# Patient Record
Sex: Male | Born: 1985 | Race: White | Hispanic: No
Health system: Western US, Academic
[De-identification: ages and names within clinical notes are randomized; demographics above are authoritative.]

## PROBLEM LIST (undated history)

## (undated) DEATH — deceased

---
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
## 2013-03-19 DIAGNOSIS — J309 Allergic rhinitis, unspecified: Secondary | ICD-10-CM

## 2013-03-19 HISTORY — DX: Allergic rhinitis, unspecified: J30.9

## 2014-12-08 ENCOUNTER — Ambulatory Visit: Payer: No Typology Code available for payment source | Attending: Family Medicine | Admitting: Physical Therapy

## 2014-12-08 DIAGNOSIS — M25551 Pain in right hip: Secondary | ICD-10-CM | POA: Insufficient documentation

## 2014-12-08 DIAGNOSIS — M6281 Muscle weakness (generalized): Secondary | ICD-10-CM | POA: Insufficient documentation

## 2014-12-08 NOTE — Therapy (Signed)
[facility] [address], [place], [address] Phone: [phone]   Fax:  [phone]  Physical Therapy Evaluation  Patient Details  Name: Kai Calico MRN: [id number] Date of Birth: 1985-07-12 Referring Provider:  Kandyce Rud, MD  Encounter Date: 12/08/2014      PT End of Session - 12/08/14 1209    Visit Number 1   Number of Visits 9   Date for PT Re-Evaluation 02/07/15   PT Start Time 0900   PT Stop Time 1000   PT Time Calculation (min) 60 min   Activity Tolerance Patient tolerated treatment well   Behavior During Therapy [facility] for tasks assessed/performed      No past medical history on file.  No past surgical history on file.  There were no vitals filed for this visit.  Visit Diagnosis:  Right hip pain - Plan: PT plan of care cert/re-cert  Muscle weakness - Plan: PT plan of care cert/re-cert      Subjective Assessment - 12/08/14 0901    Subjective Pt reports no pain currently.   Pertinent History Pt has h/o 7 months ago t noted pain with front squatting, Pain has worsened in that time due to pt continuing to squat. Pt does have intermittent low back pain on R. Pain is in B hips but worse in R.    How long can you sit comfortably? If aggravated pt has pain wiht sitting.    How long can you walk comfortably? when aggravated at terminal stance.   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   Currently in Pain? No/denies            [facility] PT Assessment - 12/08/14 0001    Assessment   Medical Diagnosis strain of hip flexor, right, initial encounter   Onset Date/Surgical Date 03/19/14   Prior Therapy none   Precautions   Precautions None   Restrictions   Weight Bearing Restrictions No   Balance Screen   Has the patient fallen in the past 6 months No   Has the patient had a decrease in activity level  because of a fear of falling?  No   Is the patient reluctant to leave their home because of a fear of falling?  No   Prior Function   Level of Independence Independent   Vocation Student   Vocation Requirements PT student requiring lifting, bending, moving people around   Leisure Pt is an active exerciser. Limited in this currently.   ROM / Strength   AROM / PROM / Strength AROM   AROM   Overall AROM Comments AROM: limited R hip ER/IR, limited R SLR. PROM = AROM. Did not assess strength formally however with all functional movements involving weighted hip motion (i.e. squat, quadruped) activation of perihip musculature incr. pt pain.   Palpation   Spinal mobility CPAs, UPAs non painful except L5 UPA reproduced pain. Pt also had notable incr. tone in R QL muscle.   Ambulation/Gait   Gait Comments R hip ER with gait, noted hip hike on R.        Objective: Trigger point dry needling performed on Proximal quadriceps, glutes. Pt had significant pain with this and noted "gravelly" feel to the needle. Following this pt had decr. Pain with hip flexor stretch which was previously painful.  Extensive manual stretching for piriformis, 5x1 min performed.  Educated pt on home performance of manual stretch using his wife or another PT student to perform, with cuing to minimize pain in this position. Also educated pt on avoiding aggravating activities.  CPAs and UPAs grade IV 2x1 min L4-L5 (right side for UPAs)  Next session look to progress to exercise as appropriate.                    PT Education - 12/08/14 1209    Education provided Yes   Education Details Educated pt on minimizing painful/aggravating activities, course of PT progression   Person(s) Educated Patient   Methods Explanation;Verbal cues   Comprehension Verbalized understanding             PT Long Term Goals - 12/08/14 1212    PT LONG TERM GOAL #1   Title Pt will ibe able to perform pain free hooklying  to indicate decr. pain with basic low level functional tasks   Time 8   Period Weeks   Status New   PT LONG TERM GOAL #2   Title Pt will be able to squat to lift a pt/transfer with no incr. pain to return to functional school related activities.   Time 8   Period Weeks   Status New               Plan - 12/08/14 1210    Clinical Impression Statement Pt is a pleasant 29 y/o male with c/o chronic R hip pain. Pain is primarily with functional tasks related to pt schooling in PT, including weighted squat, assisting with transfers, as well as pain with walking and when working out. Pt presents with decr. ROM in R hip and significant pain with palpation over R iliacus, proximal hip flexors, glutes. Pt would benefit from skilled PT to address these issues and address poor motor control with higher level functional tasks.   Pt will benefit from skilled therapeutic intervention in order to improve on the following deficits Improper body mechanics;Pain;Hypomobility;Decreased range of motion;Decreased strength   Rehab Potential Good   Clinical Impairments Affecting Rehab Potential good understanding of therapy process, pt is committed to exercising even if it is painful   PT Frequency 1x / week   PT Duration 8 weeks   PT Treatment/Interventions Therapeutic activities;Manual techniques;Therapeutic exercise   PT Next Visit Plan address trigger points, progress to hooklying heel slides.   Consulted and Agree with Plan of Care Patient         Problem List There are no active problems to display for this patient.   [name],[name] 12/08/2014, 12:15 PM  [facility] [address], [place], [address] Phone: [phone]   Fax:  [phone]

## 2014-12-14 ENCOUNTER — Ambulatory Visit: Payer: No Typology Code available for payment source | Admitting: Physical Therapy

## 2014-12-15 ENCOUNTER — Ambulatory Visit: Payer: No Typology Code available for payment source | Admitting: Physical Therapy

## 2014-12-15 DIAGNOSIS — M25551 Pain in right hip: Secondary | ICD-10-CM | POA: Diagnosis not present

## 2014-12-15 DIAGNOSIS — M6281 Muscle weakness (generalized): Secondary | ICD-10-CM

## 2014-12-15 NOTE — Therapy (Signed)
Jesus Sampson [facility] [address], [place], [address] Phone: [phone]   Fax:  [phone]  Physical Therapy Treatment  Patient Details  Name: Jesus Sampson MRN: [id number] Date of Birth: 22-May-1985 Referring Provider:  Dedra Skeens, PA-C  Encounter Date: 12/15/2014      PT End of Session - 12/15/14 0803    Visit Number 2   Number of Visits 9   Date for PT Re-Evaluation 02/07/15   PT Start Time 0715   PT Stop Time 0800   PT Time Calculation (min) 45 min   Activity Tolerance Patient tolerated treatment well   Behavior During Therapy [facility] for tasks assessed/performed      No past medical history on file.  No past surgical history on file.  There were no vitals filed for this visit.  Visit Diagnosis:  Right hip pain  Muscle weakness      Subjective Assessment - 12/15/14 0718    Subjective (p) Pt reports general hip soreness, no specific pain as he has been avoiding pain.   Pertinent History (p) Pt has h/o 7 months ago t noted pain with front squatting, Pain has worsened in that time due to pt continuing to squat. Pt does have intermittent low back pain on R. Pain is in B hips but worse in R.    How long can you sit comfortably? (p) If aggravated pt has pain wiht sitting.    How long can you walk comfortably? (p) when aggravated at terminal stance.   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   Currently in Pain? (p) No/denies            Objective: Focus of session on illustrating and correcting pt inability to shift and bear weight neutrally through the hip.  Side to side wt shift initially, followed by run stance wt shift. Cuing: Suspend the crown Knees/hips bent Relaxed chest and ribs dropped Focusing on relaxation and avoiding weight dropping through joint but through muscles including manual tapping  to activate musculature.  Unable to do so with run stance with R LE back, able to do so with L LE back.  Trigger point dry needling performed on TFL and proximal quad to improve ability to shift wt.  Following this pt had marked improvement, decr. Stiffness and improved ability to shift weight into LE musculature.                       PT Education - 12/15/14 0801    Education provided Yes   Education Details educated pt on wt bearing through R hip.   Person(s) Educated Patient   Methods Explanation;Verbal cues   Comprehension Verbalized understanding             PT Long Term Goals - 12/08/14 1212    PT LONG TERM GOAL #1   Title Pt will ibe able to perform pain free hooklying to indicate decr. pain with basic low level functional tasks   Time 8   Period Weeks   Status New   PT LONG TERM GOAL #2   Title Pt will be able to squat to lift a pt/transfer with no incr. pain to return to functional school related activities.   Time 8   Period Weeks   Status New  Plan - 12/15/14 0803    Clinical Impression Statement Pt responded well within session to muscle control and joint centration exercises, as well as dry needling.  Overall pain is improved however pt demonstrates antalgic gait and impaired ability to perform functional wt shifts.   Pt will benefit from skilled therapeutic intervention in order to improve on the following deficits Improper body mechanics;Pain;Hypomobility;Decreased range of motion;Decreased strength   Rehab Potential Good   Clinical Impairments Affecting Rehab Potential good understanding of therapy process, pt is committed to exercising even if it is painful   PT Frequency 1x / week   PT Duration 8 weeks   PT Treatment/Interventions Therapeutic activities;Manual techniques;Therapeutic exercise   PT Next Visit Plan address trigger points, progress to hooklying heel slides.   Consulted and Agree with Plan of Care  Patient        Problem List There are no active problems to display for this patient.   [name],[name] 12/15/2014, 8:08 AM  [facility] SPORTS MEDIC[facility] [address] [place], [address] Phone: [phone]   Fax:  [phone]

## 2014-12-20 ENCOUNTER — Encounter: Payer: No Typology Code available for payment source | Admitting: Physical Therapy

## 2014-12-22 ENCOUNTER — Ambulatory Visit: Payer: No Typology Code available for payment source | Attending: Orthopedic Surgery | Admitting: Physical Therapy

## 2014-12-22 DIAGNOSIS — M25551 Pain in right hip: Secondary | ICD-10-CM | POA: Insufficient documentation

## 2014-12-22 NOTE — Therapy (Signed)
[place] [facility] [address] [place], [address] Phone: [phone]   Fax:  [phone]  Physical Therapy Treatment  Patient Details  Name: Jesus Sampson MRN: [id number] Date of Birth: 1985-04-22 Referring Provider:  Dedra Skeens, PA-C  Encounter Date: 12/22/2014      PT End of Session - 12/22/14 0851    Visit Number 3   Number of Visits 9   Date for PT Re-Evaluation 02/07/15   PT Start Time 0800   PT Stop Time 0840   PT Time Calculation (min) 40 min   Activity Tolerance Patient tolerated treatment well   Behavior During Therapy [facility] for tasks assessed/performed      No past medical history on file.  No past surgical history on file.  There were no vitals filed for this visit.  Visit Diagnosis:  Right hip pain      Subjective Assessment - 12/22/14 0804    Subjective Pt reports he is continuing to modify his activity level to avoid pain. Reports he is still having difficulty with squatting and muscle activiation on R side.   Pertinent History Pt has h/o 7 months ago t noted pain with front squatting, Pain has worsened in that time due to pt continuing to squat. Pt does have intermittent low back pain on R. Pain is in B hips but worse in R.    How long can you sit comfortably? If aggravated pt has pain wiht sitting.    How long can you walk comfortably? when aggravated at terminal stance.   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   Currently in Pain? No/denies             Objective: Wt shifting in run stance both sides - doubled green band with backward shift, single red band for forward shift. 20 min total with extensive practice to achieve neutral pelvic position, avoiding substitution with shoulders, activation of shoulders.  Bridge variations focusing on pre-movement for improved stability, educated pt on  leg starting at the psoas (bottom rib) rather than lower down with excess stabilization with adductors.  Additional cuing to keep wt through foot in more neutral position to address hip control.                    PT Education - 12/22/14 0851    Education provided Yes   Education Details educated on progressive control through R hip.   Person(s) Educated Patient   Methods Explanation;Demonstration   Comprehension Verbalized understanding             PT Long Term Goals - 12/22/14 0858    PT LONG TERM GOAL #1   Title Pt will ibe able to perform pain free hooklying to indicate decr. pain with basic low level functional tasks   Time 8   Period Weeks   Status New   PT LONG TERM GOAL #2   Title Pt will be able to squat to lift a pt/transfer with no incr. pain to return to functional school related activities.   Time 8   Period Weeks   Status New               Plan - 12/22/14 1324    Clinical Impression Statement Pt is continuing to make significant improvement in use of R LE. Pt is unable to continue with PT  at this time as pt is unable to pay for visits.   Pt will benefit from skilled therapeutic intervention in order to improve on the following deficits Improper body mechanics;Pain;Hypomobility;Decreased range of motion;Decreased strength   Rehab Potential Good   Clinical Impairments Affecting Rehab Potential good understanding of therapy process, pt is committed to exercising even if it is painful   PT Frequency 1x / week   PT Duration 8 weeks   PT Treatment/Interventions Therapeutic activities;Manual techniques;Therapeutic exercise   PT Next Visit Plan address trigger points, progress to hooklying heel slides.   Consulted and Agree with Plan of Care Patient        Problem List There are no active problems to display for this patient.   Linzy Laury 12/22/2014, 9:00 AM  [facility]  MEDICINE [address] [place], [address] Phone: [phone]   Fax:  [phone]

## 2014-12-29 ENCOUNTER — Encounter: Payer: No Typology Code available for payment source | Admitting: Physical Therapy

## 2015-01-05 ENCOUNTER — Encounter: Payer: No Typology Code available for payment source | Admitting: Physical Therapy

## 2016-03-07 ENCOUNTER — Encounter: Payer: Self-pay | Admitting: Urology

## 2016-03-07 ENCOUNTER — Ambulatory Visit (INDEPENDENT_AMBULATORY_CARE_PROVIDER_SITE_OTHER): Payer: Medicaid Other | Admitting: Urology

## 2016-03-07 VITALS — BP 115/70 | HR 78 | Ht 68.0 in | Wt 187.9 lb

## 2016-03-07 DIAGNOSIS — Z3009 Encounter for other general counseling and advice on contraception: Secondary | ICD-10-CM

## 2016-03-07 MED ORDER — DIAZEPAM 10 MG PO TABS: ORAL_TABLET | ORAL | 0 refills | Status: AC

## 2016-03-07 NOTE — Progress Notes (Signed)
03/07/2016 10:32 AM   Pervis HockingKody Francee PiccoloBrandt 12/30/1985 [id number]  Referring provider: Kandyce RudMarcus Babaoff, MD [address] [facility] [place] [facility] [place], [place] [id number]  Chief Complaint  Patient presents with  . VAS Consult    new patient referred by Dr. Madelin RearBaboff    HPI: Mr. Michel BickersKody Sampson is 30 year old Caucasian male who presents today as a referral for  a vasectomy  Patient has three children, 2 daughters and one son, and wishes to end his family unit at this point.  Patient denied any history of chronic prostatitis, epididymitis, orchitis, or other genital pain.  Today, we discussed what the vas deferens is, where it is located, and its function. We reviewed the procedure for vasectomy, it's risks, benefits, alternatives, and likelihood of achieving his goals.   We discussed in detail the procedure, complications, and recovery as well as the need for clearance prior to unprotected intercourse. We discussed that vasectomy does not protect against sexually transmitted diseases. We discussed that this procedure does not result in immediate sterility and that they would need to use other forms of birth control until he has been cleared with a three month negative postvasectomy semen analyses.  I explained that the procedure is considered to be permanent and that attempts at reversal have varying degrees of success. These options include vasectomy reversal, sperm retrieval, and in vitro fertilization; these can be very expensive.   We discussed the chance of postvasectomy pain syndrome which occurs in less than 5% of patients. I explained to the patient that there is no treatment to resolve this chronic pain, and that if it developed I would not be able to help resolve the issue, but that surgery is generally not needed for correction.   I explained there have even been reports of systemic like illness associated with this chronic pain, and that there was no good cure. I  explained that vasectomy it is not a 100% reliable form of birth control, and the risk of pregnancy after vasectomy is approximately 1 in 2000 men who had a negative postvasectomy semen analysis or rare non-motile sperm.  I explained that repeat vasectomy was necessary in less than 1% of vasectomy procedures when employing the type of technique that is performed in the office. I explained that he should refrain from ejaculation for approximately one week following vasectomy. I explained that there are other options for birth control which are permanent and non-permanent; we discussed these.  I explained the rates of surgical complications, such as symptomatic hematoma or infection, are low (1-2%) and vary with the surgeon's experience and criteria used to diagnose the complication.   PMH: History reviewed. No pertinent past medical history.  Surgical History: Past Surgical History:  Procedure Laterality Date  . HERNIA REPAIR  1992    Home Medications:  Allergies as of 03/07/2016   No Known Allergies     Medication List       Accurate as of 03/07/16 10:32 AM. Always use your most recent med list.          diazepam 10 MG tablet Commonly known as:  VALIUM Take thirty minutes prior to the vasectomy       Allergies: No Known Allergies  Family History: Family History  Problem Relation Age of Onset  . Kidney cancer Neg Hx   . Kidney disease Neg Hx   . Prostate cancer Neg Hx   . Bladder Cancer Neg Hx  Social History:  reports that he has never smoked. He has never used smokeless tobacco. He reports that he does not drink alcohol or use drugs.  ROS: UROLOGY Frequent Urination?: No Hard to postpone urination?: No Burning/pain with urination?: No Get up at night to urinate?: No Leakage of urine?: No Urine stream starts and stops?: No Trouble starting stream?: No Do you have to strain to urinate?: No Blood in urine?: No Urinary tract infection?: No Sexually  transmitted disease?: No Injury to kidneys or bladder?: No Painful intercourse?: No Weak stream?: No Erection problems?: No Penile pain?: No  Gastrointestinal Nausea?: No Vomiting?: No Indigestion/heartburn?: No Diarrhea?: No Constipation?: No  Constitutional Fever: No Night sweats?: No Weight loss?: No Fatigue?: No  Skin Skin rash/lesions?: No Itching?: No  Eyes Blurred vision?: No Double vision?: No  Ears/Nose/Throat Sore throat?: Yes Sinus problems?: Yes  Hematologic/Lymphatic Swollen glands?: No Easy bruising?: No  Cardiovascular Leg swelling?: No Chest pain?: Yes  Respiratory Cough?: No Shortness of breath?: No  Endocrine Excessive thirst?: No  Musculoskeletal Back pain?: No Joint pain?: No  Neurological Headaches?: No Dizziness?: No  Psychologic Depression?: No Anxiety?: No  Physical Exam: BP 115/70   Pulse 78   Ht  (1.727 m)   Wt 187 lb 14.4 oz (85.2 kg)   BMI 28.57 kg/m   Constitutional: Well nourished. Alert and oriented, No acute distress. HEENT: [place] AT, moist mucus membranes. Trachea midline, no masses. Cardiovascular: No clubbing, cyanosis, or edema. Respiratory: Normal respiratory effort, no increased work of breathing. GI: Abdomen is soft, non tender, non distended, no abdominal masses. Liver and spleen not palpable.  No hernias appreciated.  Stool sample for occult testing is not indicated.   GU: No CVA tenderness.  No bladder fullness or masses.  Patient with circumcised phallus.  Urethral meatus is patent.  No penile discharge. No penile lesions or rashes. Scrotum without lesions, cysts, rashes and/or edema.  Testicles are located scrotally bilaterally. No masses are appreciated in the testicles. Left and right epididymis are normal. Rectal: Deferred. Skin: No rashes, bruises or suspicious lesions. Lymph: No cervical or inguinal adenopathy. Neurologic: Grossly intact, no focal deficits, moving all 4  extremities. Psychiatric: Normal mood and affect.  Assessment & Plan:    1. Vasectomy consult:  Patient has read and signed the consent.  He is given the pre-op vasectomy instruction sheet.  He is prescribed Valium 10 mg and instructed to take it 30 minutes prior to his vasectomy appointment.  He is to have a driver.  I reemphasized to the patient that this is to be considered a permanent form of birth control, that he is to use an alternative form of birth control until we receive the 3 months specimen and it is cleared of sperm and that this will not prevent STI's.  His questions are answered to his satisfaction and he understands the risks and is willing to proceed with the vasectomy.  He will schedule his vasectomy.    I spent 30 minutes in a face-to-face conversation concerning the vasectomy procedure and pre-and post op expectations.  Greater than 50% was spent in counseling & coordination of care with the patient.  Return for vasectomy.  These notes generated with voice recognition software. I apologize for typographical errors.  Michiel Cowboy, PA-C  [facility] [address] [place], [place] [address] [phone]

## 2016-03-09 ENCOUNTER — Encounter: Payer: Self-pay | Admitting: Emergency Medicine

## 2016-03-09 ENCOUNTER — Emergency Department: Payer: Medicaid Other

## 2016-03-09 ENCOUNTER — Emergency Department
Admission: EM | Admit: 2016-03-09 | Discharge: 2016-03-09 | Disposition: A | Discharge status: Home / Self Care | Payer: Medicaid Other | Attending: Emergency Medicine | Admitting: Emergency Medicine

## 2016-03-09 DIAGNOSIS — R0789 Other chest pain: Secondary | ICD-10-CM | POA: Diagnosis not present

## 2016-03-09 DIAGNOSIS — R590 Localized enlarged lymph nodes: Secondary | ICD-10-CM

## 2016-03-09 DIAGNOSIS — R1032 Left lower quadrant pain: Secondary | ICD-10-CM | POA: Diagnosis present

## 2016-03-09 LAB — CBC
HEMATOCRIT: 46.5 % (ref 40.0–52.0)
Hemoglobin: 16.4 g/dL (ref 13.0–18.0)
MCH: 29.8 pg (ref 26.0–34.0)
MCHC: 35.2 g/dL (ref 32.0–36.0)
MCV: 84.8 fL (ref 80.0–100.0)
Platelets: 211 10*3/uL (ref 150–440)
RBC: 5.48 MIL/uL (ref 4.40–5.90)
RDW: 13 % (ref 11.5–14.5)
WBC: 8.2 10*3/uL (ref 3.8–10.6)

## 2016-03-09 LAB — FIBRIN DERIVATIVES D-DIMER (ARMC ONLY): Fibrin derivatives D-dimer (ARMC): 500 — ABNORMAL HIGH (ref 0–499)

## 2016-03-09 NOTE — ED Notes (Signed)
Pt alert and oriented X4, active, cooperative, pt in NAD. RR even and unlabored, color WNL.  Pt informed to return if any life threatening symptoms occur.   
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
## 2016-03-09 NOTE — ED Triage Notes (Signed)
Pain in the left hip with radiation into the back. Ambulatory in triage. Denies trauma

## 2016-03-09 NOTE — ED Notes (Signed)
Pt presents with hip pain x 5-6 days. Started in front and now radiates to back side of hip. Denies injury or trauma. Pt is physically active, walking with no difficulty. NAD noted.

## 2016-03-09 NOTE — Discharge Instructions (Signed)
Please apply heating pa left groin. Continue to monitor lymph nodes. If continued swelling after 2 weeks, follow-up with primary care physician. Return to the emergency department for any worsening symptoms urgent changes in her health.

## 2016-03-09 NOTE — ED Provider Notes (Signed)
ED ECG REPORT I, Arelia LongestSchaevitz,  David M, the attending physician, personally viewed and interpreted this ECG.   Date: 03/09/2016  EKG Time: 1834  Rate: 60  Rhythm: sinus rhythm with sinus arrhythmia.  Axis: normal  Intervals:none  ST&T Change: no st segment elevation or depression. No abnormal T-wave inversion.    Myrna Blazeravid Matthew Schaevitz, MD 03/09/16 [phone]

## 2016-03-09 NOTE — ED Notes (Signed)
Pt transported to xray 

## 2016-03-19 HISTORY — PX: LIGATION PRQ VAS DEFERENS UNI/BI SPX: 55450

## 2016-04-13 ENCOUNTER — Encounter: Payer: Self-pay | Admitting: Urology

## 2016-04-13 ENCOUNTER — Ambulatory Visit (INDEPENDENT_AMBULATORY_CARE_PROVIDER_SITE_OTHER): Payer: Medicaid Other | Admitting: Urology

## 2016-04-13 DIAGNOSIS — Z9852 Vasectomy status: Secondary | ICD-10-CM | POA: Diagnosis not present

## 2016-04-13 NOTE — Progress Notes (Signed)
04/13/16  CC: Vasectomy    HPI:  1 - Desire for Male Sterilization - pt with  three children, 2 daughters and one son, and wishes to end his family unit at this point.  Patient denied any history of chronic prostatitis, epididymitis, orchitis, or other genital pain. Vas easily palpable on exam.     Bilateral Vasectomy Procedure  Pre-Procedure: - Patient's scrotum was prepped and draped for vasectomy. - The vas was palpated through the scrotal skin on the left. - 1% Xylocaine was injected into the skin and surrounding tissue for placement  - In a similar manner, the vas on the right was identified, anesthetized, and stabilized.  Procedure: - A bladeless technique was used to open the overlying skin (sharp dissection) - The left vas was isolated and brought up through the incision exposing that structure. - Bleeding points were cauterized as they occurred. - The vas was free from the surrounding structures and brought to the view. - A segment was positioned for placement with a hemostat. - A second hemostat was placed and a 1cm segment between the two hemostats and was removed for inspection. - Each end of the transected vas lumen was fulgurated/ obliterated using needlepoint electrocautery -A fascial interposition was performed on testicular end of the vas using #3-0 chromic suture -The same procedure was performed on the right. - A single suture of #3-0 chromic catgut was used to close each lateral scrotal skin incision - A dressing was applied.  Post-Procedure: - Patient was instructed in care of the operative area - A specimen is to be delivered in 12 weeks   -Another form of contraception is to be used until post vasectomy semen analysis is confirmed negative, he voiced understanding.   Sebastian AcheMANNY, Valecia Beske, MD

## 2016-04-27 ENCOUNTER — Telehealth: Payer: Self-pay

## 2016-04-27 NOTE — Telephone Encounter (Signed)
The pt called stating he still noticing a scanty amount bleeding from his incision site. I informed him that it is normal and its probably because he have increased his activity level. He informed me that he have been working out at [employer].  I informed him to take it easy for the next few days and if symptoms do not improve to call us back. Please advise

## 2016-05-16 ENCOUNTER — Other Ambulatory Visit: Payer: Medicaid Other

## 2016-05-16 DIAGNOSIS — Z9852 Vasectomy status: Secondary | ICD-10-CM

## 2016-05-17 LAB — POST-VAS SPERM EVALUATION,QUAL: SEMEN VOLUME: 2.7 mL

## 2016-06-11 ENCOUNTER — Other Ambulatory Visit: Payer: Medicaid Other

## 2016-06-11 DIAGNOSIS — Z9852 Vasectomy status: Secondary | ICD-10-CM

## 2016-06-13 LAB — POST-VAS SPERM EVALUATION,QUAL: SEMEN VOLUME: 2.5 mL

## 2016-08-14 ENCOUNTER — Other Ambulatory Visit: Payer: Self-pay | Admitting: Family Medicine

## 2016-08-14 DIAGNOSIS — M25511 Pain in right shoulder: Secondary | ICD-10-CM

## 2016-08-23 ENCOUNTER — Ambulatory Visit: Payer: No Typology Code available for payment source

## 2016-08-23 ENCOUNTER — Other Ambulatory Visit: Payer: No Typology Code available for payment source

## 2016-08-28 ENCOUNTER — Ambulatory Visit
Admission: RE | Admit: 2016-08-28 | Discharge: 2016-08-28 | Disposition: A | Discharge status: Home / Self Care | Payer: Medicaid Other | Source: Ambulatory Visit | Attending: Family Medicine | Admitting: Family Medicine

## 2016-08-28 DIAGNOSIS — M25511 Pain in right shoulder: Secondary | ICD-10-CM

## 2016-08-28 DIAGNOSIS — X58XXXA Exposure to other specified factors, initial encounter: Secondary | ICD-10-CM | POA: Diagnosis not present

## 2016-08-28 DIAGNOSIS — S43491A Other sprain of right shoulder joint, initial encounter: Secondary | ICD-10-CM | POA: Diagnosis not present

## 2016-08-28 MED ORDER — IOPAMIDOL (ISOVUE-200) INJECTION 41%
50.0000 mL | Freq: Once | INTRAVENOUS | Status: AC | PRN
Start: 2016-08-28 — End: 2016-08-28
  Administered 2016-08-28: 15 mL
  Filled 2016-08-28: qty 50

## 2016-08-28 MED ORDER — GADOBENATE DIMEGLUMINE 529 MG/ML IV SOLN
5.0000 mL | Freq: Once | INTRAVENOUS | Status: AC | PRN
  Administered 2016-08-28: 0.01 mL via INTRA_ARTICULAR

## 2016-08-28 MED ORDER — SODIUM CHLORIDE 0.9 % IJ SOLN
10.0000 mL | INTRAMUSCULAR | Status: DC | PRN
  Administered 2016-08-28: 20 mL
  Filled 2016-08-28: qty 10

## 2016-08-28 MED ORDER — LIDOCAINE HCL (PF) 1 % IJ SOLN
30.0000 mL | Freq: Once | INTRAMUSCULAR | Status: AC
  Administered 2016-08-28: 10 mL

## 2018-06-01 IMAGING — US US EXTREM LOW VENOUS*L*
1 series · 13 of 24 positions shown · non-contrast
Comparison: None.

CLINICAL DATA: Left leg pain and swelling



[Series 1: us extrem low venous*left* · 0.05mm/px · 13 of 34 slices shown]
[im 1/34]
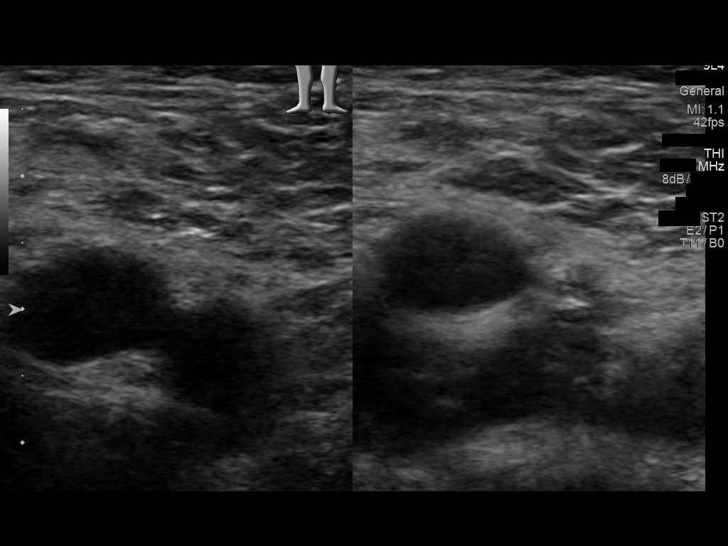
[im 3/34]
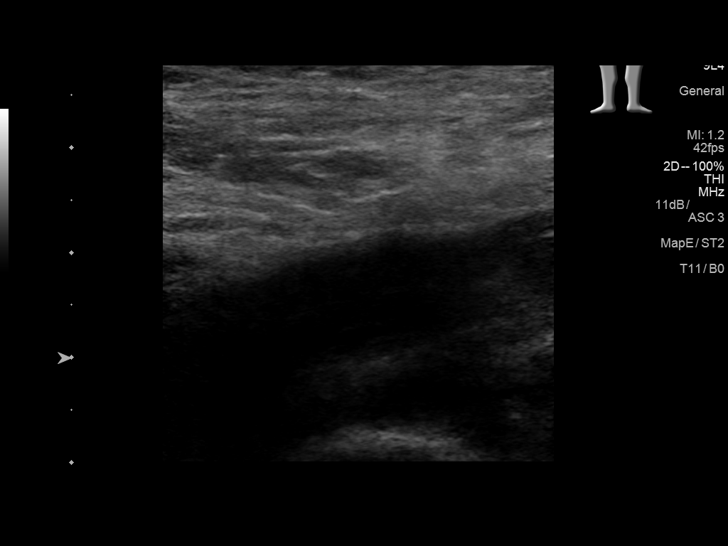
[im 6/34]
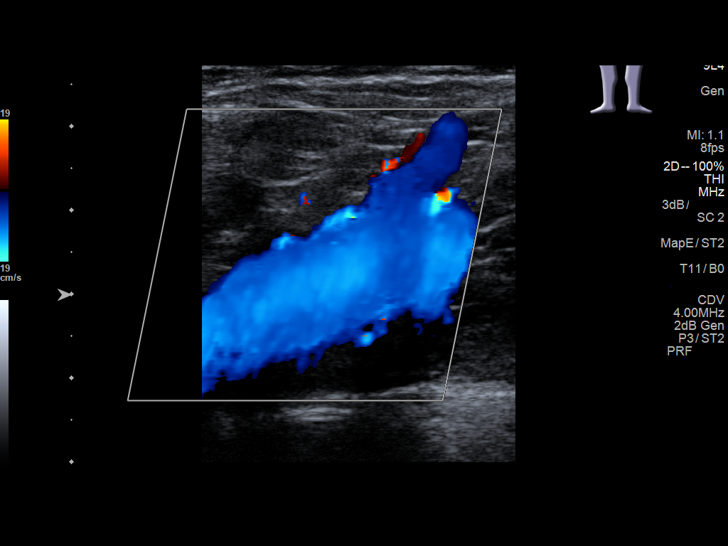
[im 9/34]
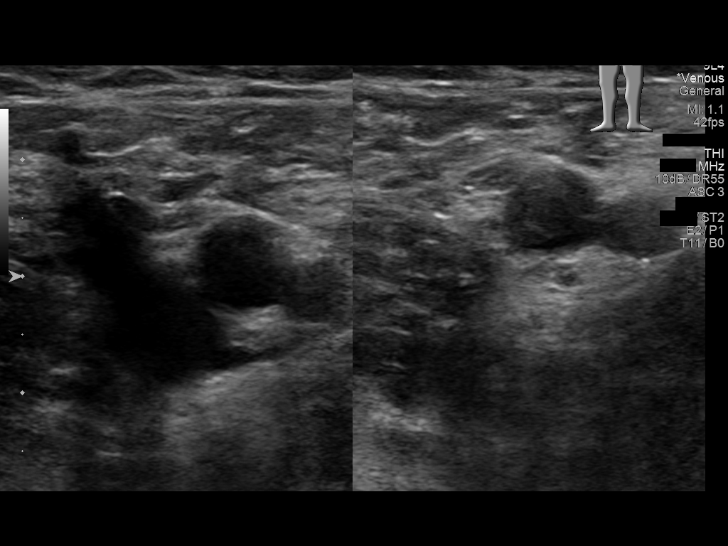
[im 12/34]
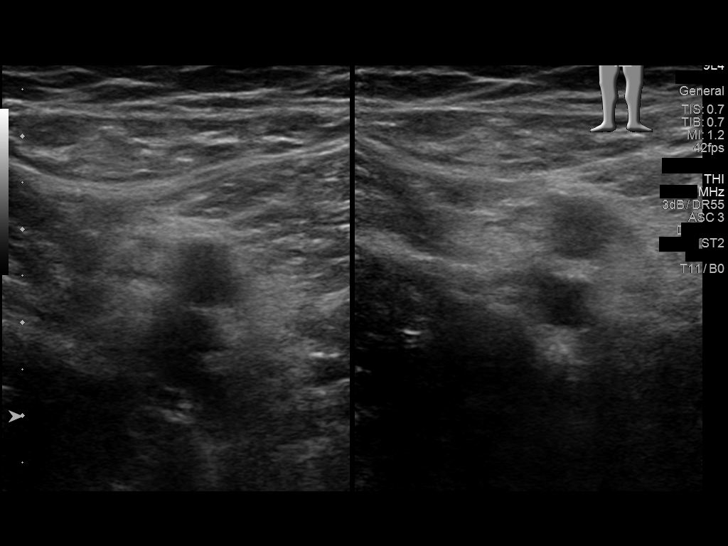
[im 15/34]
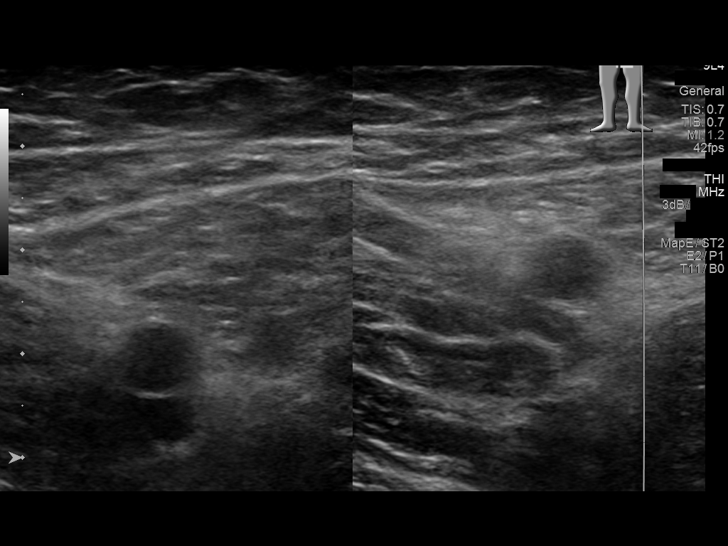
[im 18/34]
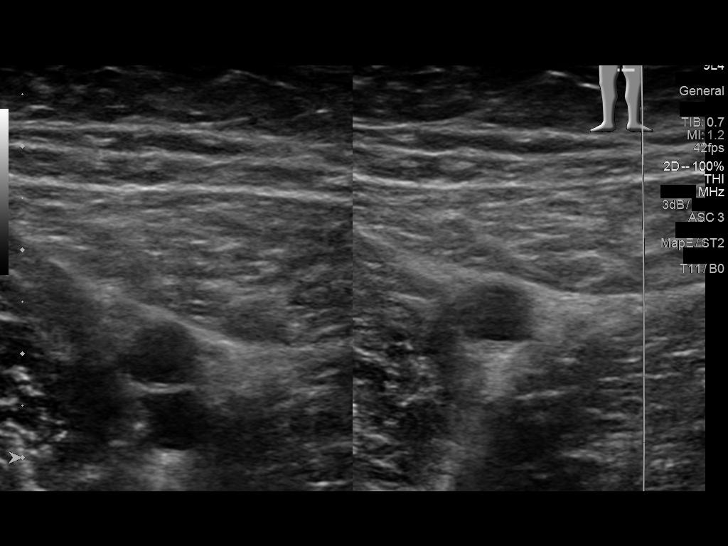
[im 19/34]
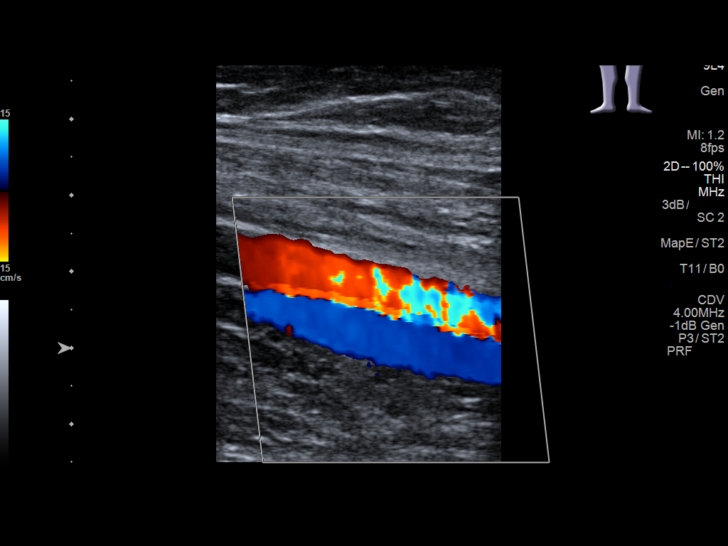
[im 22/34]
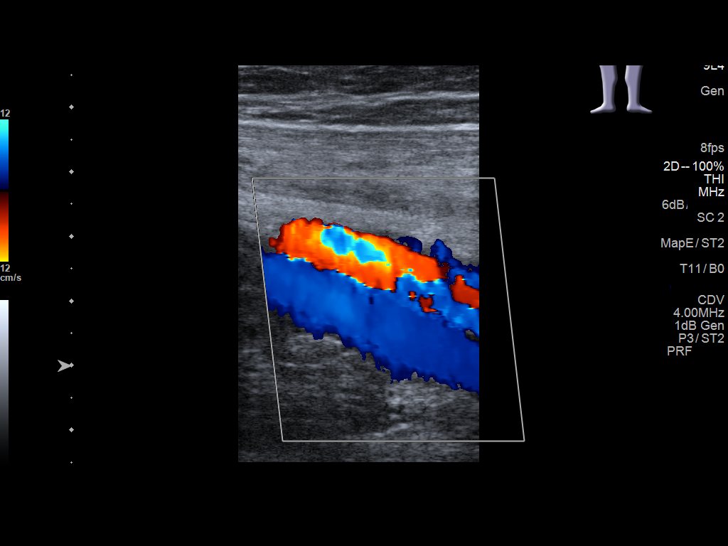
[im 25/34]
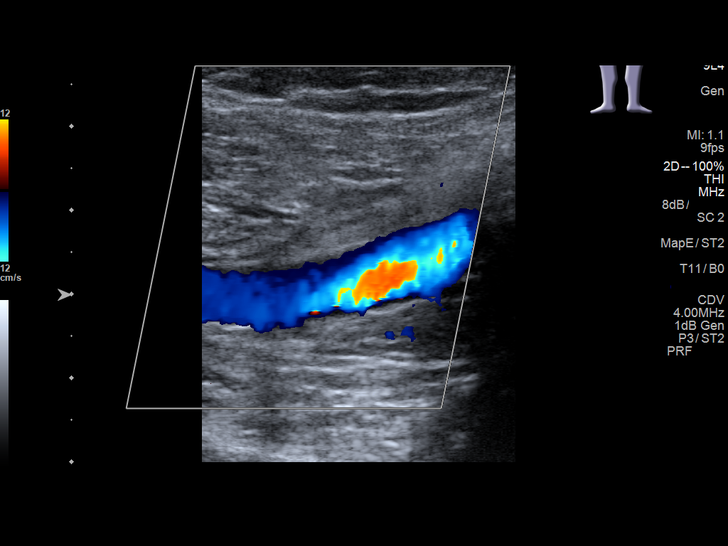
[im 28/34]
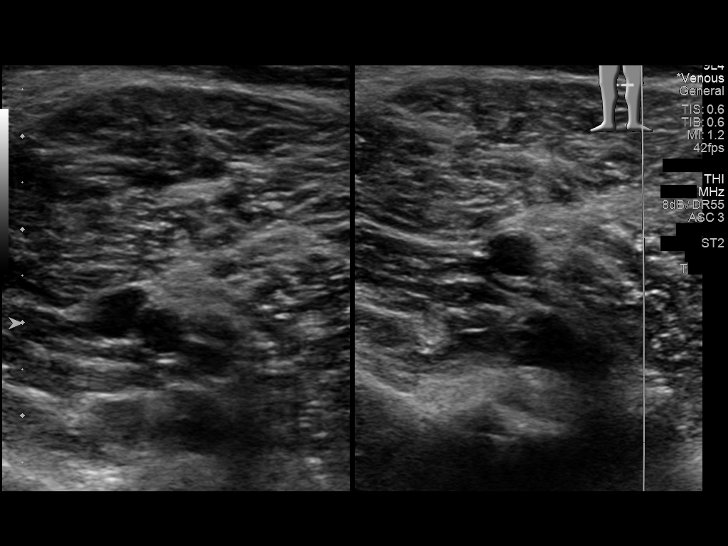
[im 31/34]
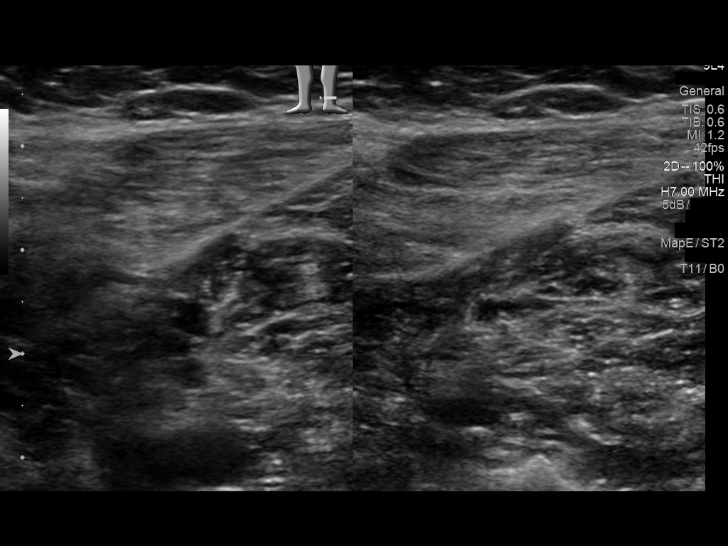
[im 34/34]
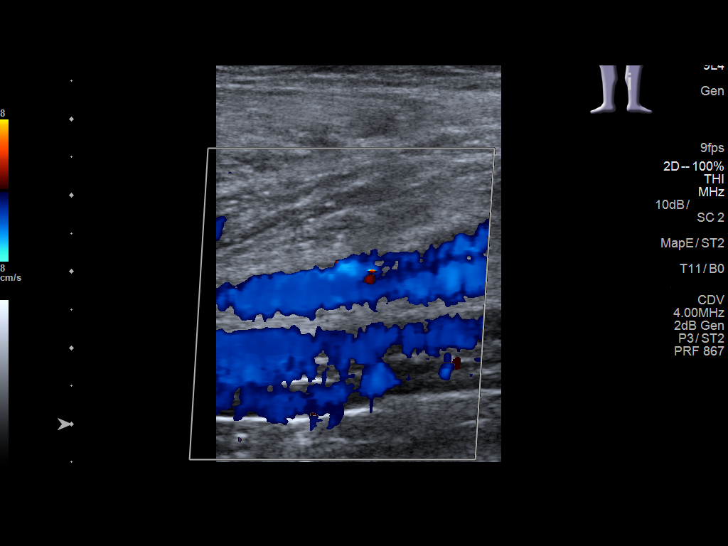

[13 of 24 positions shown; findings below may reference images not displayed]

FINDINGS: Contralateral Common Femoral Vein: Respiratory phasicity is normal
and symmetric with the symptomatic side. No evidence of thrombus.
Normal compressibility.

Common Femoral Vein: No evidence of thrombus. Normal
compressibility, respiratory phasicity and response to augmentation.

Saphenofemoral Junction: No evidence of thrombus. Normal
compressibility and flow on color Doppler imaging.

Profunda Femoral Vein: No evidence of thrombus. Normal
compressibility and flow on color Doppler imaging.

Femoral Vein: No evidence of thrombus. Normal compressibility,
respiratory phasicity and response to augmentation.

Popliteal Vein: No evidence of thrombus. Normal compressibility,
respiratory phasicity and response to augmentation.

Calf Veins: No evidence of thrombus. Normal compressibility and flow
on color Doppler imaging.

Superficial Great Saphenous Vein: No evidence of thrombus. Normal
compressibility and flow on color Doppler imaging.

Venous Reflux:  None.

Other Findings:  None.
IMPRESSION: No evidence of deep venous thrombosis.

## 2018-11-03 IMAGING — RF DG FLUORO GUIDE NDL PLC/BX
1 series · 1 of 1 positions shown · non-contrast
Comparison: none

CLINICAL DATA: Right shoulder pain for 4-5 months

[Series 1: cp_standard · 0.26mm/px · 1 of 1 slices shown]
[im 1/1]
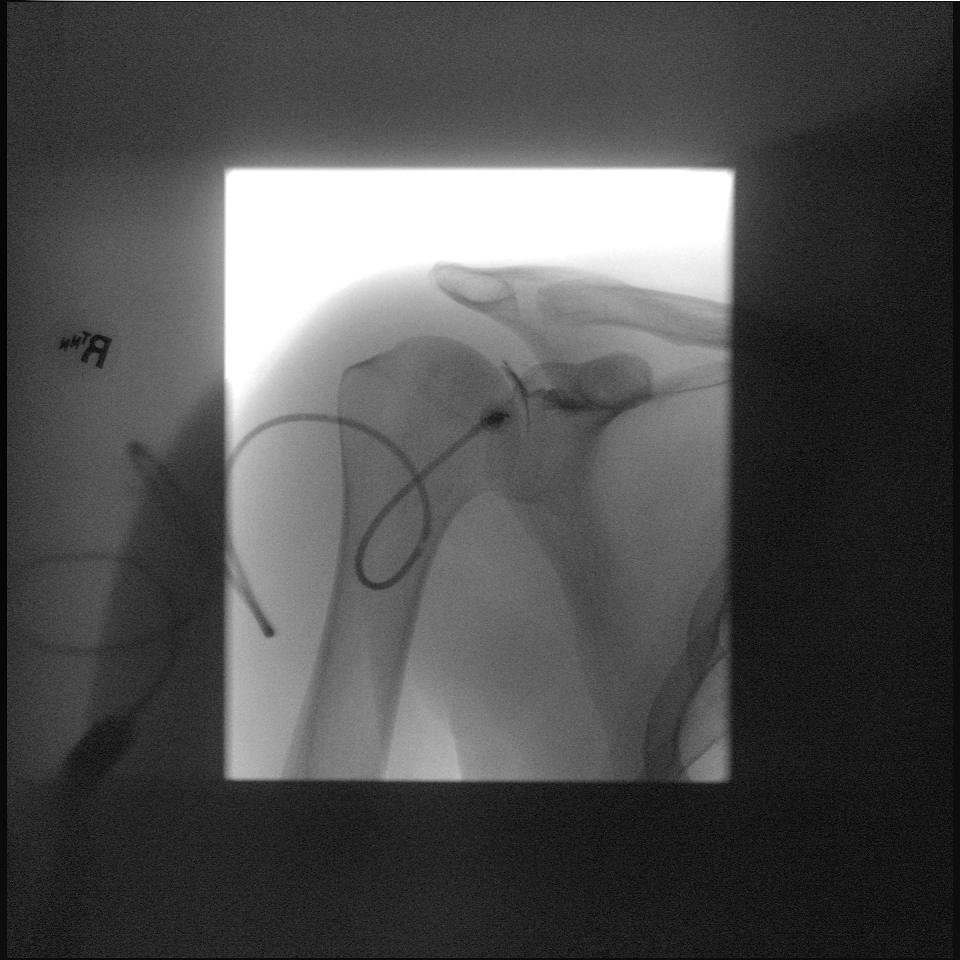

[1 of 1 positions shown; findings below may reference images not displayed]

EXAM:
RIGHT SHOULDER ARTHROGRAM INJECTION UNDER FLUOROSCOPY

FLUOROSCOPY TIME:  Fluoroscopy Time:  0.3 minutes

Radiation Exposure Index (if provided by the fluoroscopic device):
1.7 mGy

Number of Acquired Spot Images: 0

PROCEDURE:
The risks and benefits of the procedure were discussed with the
patient, and written informed consent was obtained. The patient
stated no history of allergy to contrast media. A formal timeout
procedure was performed with the patient according to departmental
protocol.

The patient was placed supine on the fluoroscopy table and the right
glenohumeral joint was identified under fluoroscopy. The skin
overlying the right glenohumeral joint was subsequently cleaned with
Chloraprep and a sterile drape was placed over the area of interest.
5 ml 1% Lidocaine was used to anesthetize the skin around the needle
insertion site.

A 22 gauge spinal needle was inserted into the right glenohumeral
joint under fluoroscopy. Position was confirmed with injection of
less than 1ml of Isovue 200 under fluoroscopy.

12 ml of gadolinium mixture (0.1 ml of Multihance mixed with 20 ml
of sterile saline) was injected into the right glenohumeral joint.

The needle was removed and hemostasis was achieved. The patient was
subsequently transferred to MRI for imaging.
IMPRESSION: Successful fluoroscopic guided right shoulder arthrogram prior to
MRI.

## 2019-05-06 ENCOUNTER — Encounter (HOSPITAL_BASED_OUTPATIENT_CLINIC_OR_DEPARTMENT_OTHER): Payer: No Typology Code available for payment source | Admitting: Urology

## 2019-05-23 NOTE — Progress Notes (Deleted)
[facility] NEW PATIENT VISIT    ID/CC:    Chief Complaint   Patient presents with   . New Patient     Vasectomy reversal consult       Referral:  Patient was self-referred.    History of Present Illness:   Philip Schmidt is a 34 year old male who presents to me for vasectomy reversal.  He had a vasectomy in 2018 in [place].  He had 3 children before his vasectomy with his ex-wife. He is otherwise healthy.     His partner is 66 years old and they are getting married in 2 days.  She has no prior children/pregnancies.  She has normal menses.      Review of Systems:   Per HPI      Past Medical Hx:    Past Medical History:   Diagnosis Date   . Allergic rhinitis due to allergen 2015     There is no problem list on file for this patient.      Past Surgical Hx:    Past Surgical History:   Procedure Laterality Date   . HERNIA REPAIR  1992   . LIGATION PRQ VAS DEFERENS UNI/BI SPX  2018   Right inguinal hernia at age 89     Family and Social Hx:    Mr. Stock , family history is not on file.Marland Kitchen He  reports that he has never smoked. He has never used smokeless tobacco. He reports previous alcohol use. He reports previous drug use..  Social History     Social History Narrative   . Not on file       Active Meds:    No outpatient medications have been marked as taking for the 05/27/19 encounter (Office Visit) with Baxter Flattery, MD.       Allergies:    Allergies as of 05/27/2019 - Reviewed 05/27/2019   Allergen Reaction Noted   . Bee pollen  05/27/2019       OBJECTIVE:  Physical Exam:    BP (!) 154/89   Pulse 65   Temp 98.2 F (36.8 C) (Temporal)   Ht 5\' 8"  (1.727 m)   Wt 179 lb 8 oz (81.4 kg)   SpO2 100%   BMI 27.29 kg/m   General: alert, no distress, healthy, well nourished  Skin: Skin color, texture, turgor normal. No rashes or concerning lesions.  Head: Normocephalic. No masses or lesions.  Eyes: Lids/periorbital skin normal, conjunctivae anicteric, EOMI.  Ears: Normal external pinnae.  Lungs: Symmetric chest  expansion, unlabored breathing on room air.  Heart: No lower extremity edema bilaterally, extremities warm, well perfused.  Abd: Soft, non-tender, non-distended.  Genitourinary: circumcised penis. Orthotopic meatus that is patent.  Testes descended bilaterally.   Right testis 50mL in volume with normal turgidity. Normal vas and cord structures.   Left testis 59mL in volume with normal turgidity.  Normal vas and cord structures.  Neurological: Alert and oriented x 3, muscle tone and strength normal, symmetric.      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 We talked about reconnecting the vas to the vas (vasovasostomy) or the vas to the epididymis (epididymovasostomy) and that this  latter operation is more challenging with a lower rate of success.I explained that the choice of operation would need to be made during surgery. He was offered sperm cryopreservation via testicular biopsy at the time of vasectomy reversal but declined. We discussed unanticipated risks of anesthesia, which include allergic reaction, blood clot, heart attack and death. The patient acknowledged his understanding of the risks, and wishes to proceed with surgery.    They would like to proceed right away with the procedure.     Plan:  - Yellow Packet for Vasectomy Reversal  - consent completed     Thank you very much for referring Philip Schmidt to our clinic at the [facility].

## 2019-05-26 ENCOUNTER — Other Ambulatory Visit: Payer: Self-pay

## 2019-05-27 ENCOUNTER — Encounter (HOSPITAL_BASED_OUTPATIENT_CLINIC_OR_DEPARTMENT_OTHER): Payer: Self-pay | Admitting: Urology

## 2019-05-27 ENCOUNTER — Ambulatory Visit: Payer: No Typology Code available for payment source | Attending: Urology | Admitting: Urology

## 2019-05-27 VITALS — BP 154/89 | HR 65 | Temp 98.2°F | Ht 68.0 in | Wt 179.5 lb

## 2019-05-27 DIAGNOSIS — N469 Male infertility, unspecified: Secondary | ICD-10-CM | POA: Insufficient documentation

## 2019-05-27 NOTE — Progress Notes (Signed)
[facility] NEW PATIENT VISIT     ID/CC:        Chief Complaint   Patient presents with    New Patient     Vasectomy reversal consult     Referral:   Patient was self-referred.     History of Present Illness:   Philip Schmidt is a 34 year old male who presents to me for vasectomy reversal. He had a vasectomy in 2018 in [place]. He had 3 children before his vasectomy with his ex-wife. He is otherwise healthy.   His partner is 73 years old and they are getting married in 2 days. She has no prior children/pregnancies. She has normal menses.     Review of Systems:   Per HPI   Past Medical Hx:   Medical History   None    Past Surgical Hx:                                     Right inguinal hernia at age 73     Family and Social Hx:   Philip Schmidt , family history is not on file.Marland Kitchen He reports that he has never smoked. He has never used smokeless tobacco. He reports previous alcohol use. He reports previous drug use..   Social History         Social History Narrative    Not on file   Active Meds:   No outpatient medications have been marked as taking for the 05/27/19 encounter (Office Visit) with Baxter Flattery, MD.   Allergies:         Allergies as of 05/27/2019 - Reviewed 05/27/2019   Allergen Reaction Noted    Bee pollen  05/27/2019   OBJECTIVE:   Physical Exam:   BP (!) 154/89   Pulse 65   Temp 98.2 F (36.8 C) (Temporal)   Ht 5\' 8"  (1.727 m)   Wt 179 lb 8 oz (81.4 kg)   SpO2 100%   BMI 27.29 kg/m     General: alert, no distress, healthy, well nourished   Skin: Skin color, texture, turgor normal. No rashes or concerning lesions.   Head: Normocephalic. No masses or lesions.   Eyes: Lids/periorbital skin normal, conjunctivae anicteric, EOMI.   Ears: Normal external pinnae.   Lungs: Symmetric chest expansion, unlabored breathing on room air.   Heart: No lower extremity edema bilaterally, extremities warm, well perfused.   Abd: Soft, non-tender, non-distended.   Genitourinary: circumcised penis. Orthotopic meatus that  is patent.   Testes descended bilaterally.   Right testis 3mL in volume with normal turgidity. Normal vas and cord structures.   Left testis 80mL in volume with normal turgidity. Normal vas and cord structures.   Neurological: Alert and oriented x 3, muscle tone and strength normal, symmetric.       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 We talked about reconnecting the vas to the vas (vasovasostomy) or the vas to the epididymis (epididymovasostomy) and that this latter operation is more challenging with a lower rate of success. I explained that the choice of operation would need  to be made during surgery. He was offered sperm cryopreservation via testicular biopsy at the time of vasectomy reversal but declined. We discussed unanticipated risks of anesthesia, which include allergic reaction, blood clot, heart attack and death. The patient acknowledged his understanding of the risks, and wishes to proceed with surgery.   They would like to proceed right away with the procedure.     Plan:   - Yellow Packet for Vasectomy Reversal   - consent completed   Thank you very much for referring Philip Schmidt to our clinic at the [facility].

## 2019-05-27 NOTE — Progress Notes (Signed)
Pre-operative review of systems and RN teaching completed with patient today, prior to upcoming surgery: Vasectomy reversal    Questions Answered:  Pt prefers to be called: Maddock  Language: English  Interpreter needed: No  Any Occupational, Physical or Speech difficulties:  No  Limitations   -glasses: No  -dentures: No  -hearing aids: No  Pain: No; if yes number 1-10:  location:   Any concerns of abuse in the home: No  Behavioral comments:   Family/friend name for postop support: Shanda Bumps , phone number:   Cultural or spiritual practices that the hospital would need to know about: No     History of MRSA infection? No    Orders/Worksheet for Surgery filled out by Oswaldo Milian, MD. I reviewed Codey's overall health and Perioperative Assessment reviewed and given to surgery scheduler.  I provided [place] with the following [facility] handouts:  About Your Surgery Experience, Medicines to Avoid Before Surgery, Avoiding Constipation, When You Leave the Hospital (Pain control and safety for patients), optional Healthcare Directives forms, and today's After Visit Summary with detailed post-operative instructions.    Reviewed patient's medications and instructed as follows:  Hold aspirin like products, anti-inflammatories, vitamins and supplements X 7 days prior to surgery.    I reviewed fasting guidelines, pre-surgery shower instructions, ride-home requirements for outpatient surgery, and medications to avoid to reduce bleeding, according to the instructions outlined in the above handouts and patient's AVS. Tyreque stated understanding. I answered all of his questions today and provided our clinic's phone number for any further questions should they arise.    Scheduling worksheet given to clinic [facility] to finalize scheduling.    Meyer Russel, RN

## 2019-05-27 NOTE — Patient Instructions (Signed)
Planning for Surgery at [facility]   Video:  [email or web address]      Surgery Scheduler:  [phone]    For Medical Questions: [phone]    Reminders:    ? You should get a call from our surgery scheduler within a week if you have not already met in person. You may call her after 7 days if you have not heard from her.  ? You MUST have a responsible adult prepared to take you home after the surgery.   ? Don't take aspirin like substances 7-10 days before surgery. Please review the provided handout.  o If you have any questions about your medicines, please call the clinic: [phone], opt 2  o Hold aspirin like products, anti-inflammatories, vitamins and supplements X 7 days  ? Take a shower with the soap provided at your appointment.  Use the night before and the morning of your surgery.  Just soap up below the neck.  Do not use fragrances, deoderant/antiperspirant, hair products, lotions, mouthwash or makeup. Review handout.  ? DO NOT eat any solid foods after midnight, the night before surgery.   o DO NOT drink alcohol after midnight, the night before surgery.   o You may drink CLEAR liquids (water, clear juices - no pulp, carbonated drinks, clear tea or coffee- no creamers or milk) until 2 hours prior to your scheduled arrival time.  ? You will receive a call from the Surgery Services Scheduling Office 1-2 business days before surgery with your scheduled arrival time for the next day. They will only be able to tell you what time to arrive and where to park.  If you have other questions, please call the clinic [phone] opt 2 or use your e-care account to message the clinical staff.  o If you have not received a call from the Scheduling Office by 5PM the day before surgery, please call [phone].  ? DAY OF SURGERY  o If you need to take medicine on the morning of your surgery, take it with only a small sip of water.  o Take your second pre-surgical  shower with the soap we gave you at your clinic visit.  o Leave all jewelry and valuables at home  o Wear clean, loose and comfortable clothing  o If you are staying overnight in the hospital, a family member or friend may stay with you.  _____________________________________________________________________    About Your Surgery  Vasectomy reversal    This explains how to prepare and what to expect after having surgery to reverse a vasectomy. This surgery is done to help couples reach their long-term family planning goals.    How to Prepare  ? For 10 days before your surgery, do not take any aspirin or aspirin-like drugs such as ibuprofen (Motrin, Advil) or naproxen (Aleve, Naprosyn). These drugs may cause excess bleeding during and after your surgery.  ? You may eat normally the evening before your surgery. Do not eat or drink anything after midnight.    Morning of Your Surgery  ? Follow the directions you received from your anesthesiologist for the morning of surgery. If you did not receive specific directions, do not eat or drink anything on the morning of your surgery.   ? If you have medicines that you must take in the morning before your surgery, take them with only a small sip of water.  ? Wear loose, comfortable clothing.      A vasectomy reversal is done to help couples reach   their long-term family planning goals.    Day of Surgery    Precautions  For 24 hours after your surgery, do not:  ? Drive.  ? Use machinery.   ? Eat any heavy or large meals. A heavy meal may be hard to digest.   ? Drink alcohol.  ? Make important decisions. The anesthesia you received can make it hard to think clearly. It can take up to 24 hours to wear off.    Self-care  For the first 24 hours after your surgery.   ? Rest. This will help reduce swelling.  ? Apply cold packs (such as a bag of frozen peas) to your groin area to help reduce swelling:   Cover the area with a towel first. Do not place the cold pack directly on your  skin.   Leave the cold pack on for 20 minutes, then off for 20 minutes. Keep doing this for the first 24 hours after your procedure. Keep the area cool, NOT cold.    After Surgery    Common Symptoms  You may have discomfort after your surgery. These common symptoms do not require a doctors attention:   ? Bruising of your scrotum and the base of your penis. This may take about 1 to 2 weeks to go away.  ? Some swelling of your scrotum.  ? A small amount of thin, clear, pinkish fluid draining from your incision. This may last for a few days after your surgery. Keep the area clean and dry.  ? If you had general anesthesia, you may have sore throat, nausea, constipation, or body aches. These symptoms should go away within 48 hours.    Fluids and Food  ? Make sure you drink plenty of water so you stay hydrated.  ? After surgery, start with clear liquids or light foods. Slowly add your usual foods to your diet, as you are able to handle them. Avoid spicy or greasy foods.    Pain Medicine   ? For moderate pain, take the pain medicine your doctor prescribed. Many doctors prescribe Vicodin, which contains acetaminophen and a prescription pain reliever. If you are taking Vicodin, always take it with food in your stomach so that you do not get nauseated.   ? Do not drive while you are taking prescription pain medicine.   ? You can take acetaminophen (Tylenol), ibuprofen (Advil, Motrin), or naproxen (Aleve, Naprosyn) for mild discomfort 2 to 3 days after your surgery.   ? Do not take acetaminophen while you are taking Vicodin. If you are given pain medicine other than Vicodin, ask your doctor or nurse if it is safe to take acetaminophen while you are taking your prescription pain medicine.    Day After Surgery  ? Call the [facility] at [phone] to make a follow-up appointment in 1 to 2 weeks if you have not already done so.  ? Wear your athletic support.  ? Pain and swelling may be worse today than yesterday.   ?  Expect a small amount of bloody discharge from the incision.   ? The skin around your incision site, scrotum, and on the base of your penis may look bruised. This is normal.    After the Second Day  ? Remove all dressings (bandages) from inside your athletic support 48 hours after your surgery.   ? After you remove the dressings, you can shower. If the gauze dressing sticks to your scrotum, get the dressing wet in the shower  and it will come off more easily.  ? Continue wearing the athletic support for 1 week.   ? Your incision will be closed with sutures. These do not need to be removed. They will dissolve in 2 to 3 weeks.  ? You may resume normal, light activity 48 hours after surgery or when you feel better. Let your body be your guide. If an activity causes discomfort, slow down or stop and rest. Wait a while before you try that activity again.   ? You may resume more vigorous activities such as jogging or weight lifting 2 to 3 weeks after your surgery. The exact timing will depend on the type of procedure you had.   ? Do not have any sexual activity, including masturbation, for 2 to 3 weeks after surgery. This timing will also depend on the type of procedure you had.  ? You may return to work in 3 to 4 days, or when you feel comfortable enough to do so.     Follow-up Visit for Semen Analysis  ? You will need to come to our clinic for semen collection and analysis 6 weeks after your vasectomy reversal.   ? You must not ejaculate for 2 to 5 days before this appointment.   ? Call the [facility] to schedule this visit.  ? Depending on the results of this first semen analysis, more testing may be needed.     When to Call the Clinic  Complications after this procedure are rare. Call the [facility] at [phone] during business hours, or go to the Emergency Room after hours if you have:  ? Fever higher than 101F (38.3C) and shaking and/or chills, especially if your incision area is also warm,  swollen, red, and painful, with pus draining from the site. This could mean that you have an infection. You may need to take antibiotics.  ? Severe bruising (black and blue skin), especially if you also have throbbing pain or the area around your scrotum is bulging. This could mean that you have a hematoma (bleeding under the skin). A hematoma may need to be drained.  ? Any other symptoms that you are concerned about.    Questions?    Your questions are important. Call or send an e-care message to your doctor or health care provider if you have questions or concerns.     [place]: [phone]  [email or web address] from 8 a.m. to 5 p.m.    After hours and on weekends or holidays, call this same number or go to the Emergency Room.  _____________________________________________________________________    Constipation After Surgery    Constipation is a common problem after surgery. It can be prevented or managed with a few simple steps:   Take a stool softener.   Eat a diet high in fiber.   Increase fluids.   Do light physical activity.     What causes constipation after surgery? These things can lead to constipation after surgery: a change in your regular eating habits, a decrease in fluid intake, narcotic pain medicine, and a decrease in your daily activity.    Tips to Lessen Constipation   Take the stool softener that your doctor prescribed as directed (Miralax, Colace, or Docusate).   Eat a diet high in fiber. Some high-fiber foods are breakfast cereal with 5 grams or more per serving (Shredded Wheat, AllBran, Fiber One), peanuts, whole wheat bread, parsnips, grapefruit, cantaloupe, carrots, prunes, peas, beans, split peas, pears, and mangos.  Increase the amount of fluids you drink. This will keep your stool soft. Drink 6 to 8 glasses of water a day. Signs that you are not drinking enough are:   - You are urinating less than normal.   - Your urine is dark in color.   - You get dizzy or lightheaded when you  stand up.   Try to eat at the same time each day. Eating breakfast at the same time every day can help your bowels get back on schedule.   Drink coffee or prune juice with breakfast. Decaf coffee works as well as caffeinated.   Exercise or walk to stimulate your bowels.   Do not delay getting to the bathroom. If you feel the urge to have a bowel movement, head to the bathroom.   Laxatives can be useful to get things started. Milk of magnesia works overnight. You can buy this at a drugstore without a prescription.    When to Call Your Doctor  To prevent problems with your healing process after surgery call your doctor if:   You have to strain hard to have a bowel movement.   It has been 3 days since your surgery, you have tried the Tips to  Lessen Constipation in this handout, and you still have not had a  bowel movement.   You are nauseated and throwing up.   You feel dizzy or lightheaded when you stand up.    Please call the [facility] at [phone] if you have any questions or concerns.

## 2019-05-30 NOTE — Progress Notes (Signed)
I saw and evaluated the patient. I have reviewed the resident's documentation and agree with it.

## 2019-07-03 ENCOUNTER — Encounter (HOSPITAL_BASED_OUTPATIENT_CLINIC_OR_DEPARTMENT_OTHER): Payer: Self-pay | Admitting: Urology

## 2019-07-13 ENCOUNTER — Telehealth (HOSPITAL_COMMUNITY): Payer: Self-pay

## 2019-07-13 NOTE — Telephone Encounter (Signed)
Updated home medication list prior to surgery

## 2019-07-15 NOTE — Anesthesia Preprocedure Evaluation (Addendum)
Patient: Philip Schmidt    Procedure Information     Date/Time: 07/17/19 1030    Procedure: REVERSAL, VASECTOMY, USING OPERATING MICROSCOPE (N/A Scrotum)    Location: [facility] / [place] [facility]    Surgeons: Baxter Flattery, MD        HPI:   Per 05/27/19 urology note  [place] Philip Schmidt is a 34 year old male who presents to me for vasectomy reversal.    Relevant Problems   No relevant active problems       Relevant surgical history:   Past Surgical History:   Procedure Laterality Date    HERNIA REPAIR  1992    LIGATION PRQ VAS DEFERENS UNI/BI SPX  2018       Medications:   Prior to Admission medications    Not on File       Review of patient's allergies indicates:  Allergies   Allergen Reactions    Bee Pollen      Seasonal allergies       Social History:   Social History     Tobacco Use    Smoking status: Never Smoker    Smokeless tobacco: Never Used   Substance Use Topics    Alcohol use: Not Currently     Alcohol/week: 0.0 standard drinks    Drug use: Not Currently       Medical History and Review of Systems     Source of Information: Chart review  Previous anesthesia: Yes  no history of anesthetic complications:    no family history of anesthetic complications:    Functional Status  exercise regularly and able to lay flat and still for 30 minutes Cardio and resistance 3-4 times per week    Cardiovascular  neg cardio ROS    Pulmonary  neg pulmonary ROS    Neuro/Psych  neg neuro/psych ROS(+) , ,     HEENT  negative HEENT ROS    GI/Hepatic/Renal  neg GI/hepatic/renal ROS     Endo/Immunology  neg Endo/immunology ROS    Hematology  negative hematology ROS    Oncology  negative oncology ROS    Musculoskeletal  negative musculoskeletal ROS     Skin  negative skin ROS        Physical Exam  Airway  Mallampati:  I  TM distance:  >6 cm  Mouth Opening:  Normal  Facial Hair:  Beard    Dental  normal      Cardiovascular  normal    Rhythm:  Regular  Rate:  Normal    Pulmonary  normal             Labs:      Relevant procedures / diagnostic studies:     Anesthesia Plan    PAT Discussion  PAT Anesthesia Plan: general    Supervising Provider - Day of Procedure  ASA 1     Planned Anesthetic Type: general    Anesthetic plan and risks discussed with patient.          Risk Calculators / Scores:     PONV: Low Risk            (!)  Non-smoker        Criteria that do not apply:    Male patient    History of PONV    History of motion sickness    Intended opioid administration

## 2019-07-16 ENCOUNTER — Encounter (HOSPITAL_BASED_OUTPATIENT_CLINIC_OR_DEPARTMENT_OTHER): Payer: Self-pay | Admitting: Urology

## 2019-07-17 ENCOUNTER — Other Ambulatory Visit (HOSPITAL_BASED_OUTPATIENT_CLINIC_OR_DEPARTMENT_OTHER): Payer: Self-pay

## 2019-07-17 ENCOUNTER — Encounter (HOSPITAL_BASED_OUTPATIENT_CLINIC_OR_DEPARTMENT_OTHER): Payer: Self-pay | Admitting: Pain Medicine

## 2019-07-17 ENCOUNTER — Ambulatory Visit (HOSPITAL_BASED_OUTPATIENT_CLINIC_OR_DEPARTMENT_OTHER)
Admission: RE | Disposition: A | Discharge status: Home / Self Care | Payer: Self-pay | Source: Home / Self Care | Attending: Urology

## 2019-07-17 ENCOUNTER — Encounter (HOSPITAL_BASED_OUTPATIENT_CLINIC_OR_DEPARTMENT_OTHER): Payer: Self-pay | Admitting: Urology

## 2019-07-17 ENCOUNTER — Inpatient Hospital Stay
Admission: RE | Admit: 2019-07-17 | Discharge: 2019-07-17 | Disposition: A | Discharge status: Home / Self Care | Payer: Self-pay | Attending: Urology | Admitting: Urology

## 2019-07-17 DIAGNOSIS — N469 Male infertility, unspecified: Secondary | ICD-10-CM

## 2019-07-17 DIAGNOSIS — N46023 Azoospermia due to obstruction of efferent ducts: Secondary | ICD-10-CM

## 2019-07-17 DIAGNOSIS — Z91030 Bee allergy status: Secondary | ICD-10-CM | POA: Insufficient documentation

## 2019-07-17 DIAGNOSIS — Z01818 Encounter for other preprocedural examination: Secondary | ICD-10-CM

## 2019-07-17 DIAGNOSIS — Z9889 Other specified postprocedural states: Secondary | ICD-10-CM | POA: Insufficient documentation

## 2019-07-17 SURGERY — REVERSAL, VASECTOMY, USING OPERATING MICROSCOPE
Anesthesia: General | Site: Scrotum | Wound class: Class I/ Clean

## 2019-07-17 MED ORDER — LABETALOL HCL 5 MG/ML IV SOLN
5.0000 mg | INTRAVENOUS | Status: DC | PRN
Start: 2019-07-17 — End: 2019-07-17

## 2019-07-17 MED ORDER — GLYCOPYRROLATE 0.2 MG/ML IJ SOLN
0.2000 mg | INTRAMUSCULAR | Status: DC | PRN
Start: 2019-07-17 — End: 2019-07-17

## 2019-07-17 MED ORDER — CEFAZOLIN SODIUM-DEXTROSE 2-4 GM/100ML-% IV SOLN
2.0000 g | INTRAVENOUS | Status: AC
Start: 2019-07-17 — End: 2019-07-17
  Administered 2019-07-17: 2 g via INTRAVENOUS
  Filled 2019-07-17: qty 100

## 2019-07-17 MED ORDER — ACETAMINOPHEN 500 MG OR TABS
ORAL_TABLET | ORAL | Status: AC
Start: 2019-07-17 — End: ?
  Filled 2019-07-17: qty 2

## 2019-07-17 MED ORDER — SCOPOLAMINE 1 MG/3DAYS TD PT72
MEDICATED_PATCH | TRANSDERMAL | Status: AC
Start: 2019-07-17 — End: ?
  Filled 2019-07-17: qty 1

## 2019-07-17 MED ORDER — FENTANYL CITRATE (PF) 50 MCG/ML IJ SOLN WRAPPER (ANESTHESIA OSM ONLY)
INTRAMUSCULAR | Status: DC | PRN
Start: 2019-07-17 — End: 2019-07-17
  Administered 2019-07-17: 100 ug via INTRAVENOUS

## 2019-07-17 MED ORDER — LACTATED RINGERS BOLUS
500.0000 mL | Freq: Once | INTRAVENOUS | Status: DC | PRN
Start: 2019-07-17 — End: 2019-07-17

## 2019-07-17 MED ORDER — OXYCODONE HCL 5 MG OR TABS
5.0000 mg | ORAL_TABLET | ORAL | 0 refills | Status: AC | PRN
Start: 2019-07-17 — End: 2019-07-22
  Filled 2019-07-17: qty 10, 2d supply, fill #0

## 2019-07-17 MED ORDER — IBUPROFEN 600 MG OR TABS
600.0000 mg | ORAL_TABLET | Freq: Four times a day (QID) | ORAL | 0 refills | Status: AC
Start: 2019-07-17 — End: 2019-07-31
  Filled 2019-07-17: qty 56, 14d supply, fill #0

## 2019-07-17 MED ORDER — SYRINGE (ANESTHESIA OSM ONLY)
INTRAVENOUS | Status: DC | PRN
Start: 2019-07-17 — End: 2019-07-17
  Administered 2019-07-17: 25 ug/kg/min via INTRAVENOUS

## 2019-07-17 MED ORDER — PROPOFOL 10 MG/ML IV EMUL WRAPPER (OSM ONLY)
INTRAVENOUS | Status: DC | PRN
Start: 2019-07-17 — End: 2019-07-17
  Administered 2019-07-17: 50 mg via INTRAVENOUS
  Administered 2019-07-17: 200 mg via INTRAVENOUS
  Administered 2019-07-17 (×2): 20 mg via INTRAVENOUS

## 2019-07-17 MED ORDER — OXYCODONE HCL 5 MG OR TABS
5.0000 mg | ORAL_TABLET | ORAL | Status: DC | PRN
Start: 2019-07-17 — End: 2019-07-17

## 2019-07-17 MED ORDER — FENTANYL CITRATE (PF) 100 MCG/2ML IJ SOLN
12.5000 ug | INTRAMUSCULAR | Status: DC | PRN
Start: 2019-07-17 — End: 2019-07-17

## 2019-07-17 MED ORDER — ACETAMINOPHEN 325 MG OR TABS
650.0000 mg | ORAL_TABLET | Freq: Four times a day (QID) | ORAL | 0 refills | Status: AC
Start: 2019-07-17 — End: 2019-07-31
  Filled 2019-07-17: qty 112, 14d supply, fill #0

## 2019-07-17 MED ORDER — DEXAMETHASONE SOD PHOSPHATE PF 10 MG/ML IJ SOLN
INTRAMUSCULAR | Status: DC | PRN
Start: 2019-07-17 — End: 2019-07-17
  Administered 2019-07-17: 4 mg via INTRAVENOUS

## 2019-07-17 MED ORDER — PROPOFOL 500 MG/50ML IV EMUL INFUSION
INTRAVENOUS | Status: AC
Start: 2019-07-17 — End: ?
  Filled 2019-07-17: qty 50

## 2019-07-17 MED ORDER — BUPIVACAINE HCL (PF) 0.5 % IJ SOLN
INTRAMUSCULAR | Status: DC | PRN
Start: 2019-07-17 — End: 2019-07-17
  Administered 2019-07-17: 30 mL via INTRAMUSCULAR

## 2019-07-17 MED ORDER — SCOPOLAMINE 1 MG/3DAYS TD PT72
1.0000 | MEDICATED_PATCH | TRANSDERMAL | Status: DC
Start: 2019-07-17 — End: 2019-07-17
  Administered 2019-07-17: 1 via TRANSDERMAL

## 2019-07-17 MED ORDER — HALOPERIDOL LACTATE 5 MG/ML IJ SOLN
1.0000 mg | Freq: Once | INTRAMUSCULAR | Status: DC | PRN
Start: 2019-07-17 — End: 2019-07-17

## 2019-07-17 MED ORDER — KETOROLAC TROMETHAMINE 15 MG/ML IJ SOLN
INTRAMUSCULAR | Status: DC | PRN
Start: 2019-07-17 — End: 2019-07-17
  Administered 2019-07-17: 15 mg via INTRAVENOUS

## 2019-07-17 MED ORDER — SODIUM CHLORIDE 0.9 % IR SOLN
Status: DC | PRN
Start: 2019-07-17 — End: 2019-07-17
  Administered 2019-07-17: 250 mL

## 2019-07-17 MED ORDER — LIDOCAINE HCL PF 2% IV/IJ SOSY/SOLN WRAPPER (ANESTHESIA OSM ONLY)
INTRAVENOUS | Status: DC | PRN
Start: 2019-07-17 — End: 2019-07-17
  Administered 2019-07-17: 80 mg via INTRAVENOUS

## 2019-07-17 MED ORDER — ACETAMINOPHEN 500 MG OR TABS
1000.0000 mg | ORAL_TABLET | ORAL | Status: AC
Start: 2019-07-17 — End: 2019-07-17
  Administered 2019-07-17: 1000 mg via ORAL

## 2019-07-17 MED ORDER — MECLIZINE HCL 12.5 MG OR TABS
12.5000 mg | ORAL_TABLET | ORAL | Status: AC
Start: 2019-07-17 — End: 2019-07-17
  Administered 2019-07-17: 12.5 mg via ORAL

## 2019-07-17 MED ORDER — ONDANSETRON HCL 4 MG/2ML IJ SOLN
4.0000 mg | INTRAMUSCULAR | Status: DC | PRN
Start: 2019-07-17 — End: 2019-07-17

## 2019-07-17 MED ORDER — STERILE WATER FOR IRRIGATION IR SOLN
Status: DC | PRN
Start: 2019-07-17 — End: 2019-07-17
  Administered 2019-07-17: 250 mL

## 2019-07-17 MED ORDER — LACTATED RINGERS IV SOLN
INTRAVENOUS | Status: DC | PRN
Start: 2019-07-17 — End: 2019-07-17

## 2019-07-17 MED ORDER — ONDANSETRON HCL 4 MG/2ML IJ SOLN
INTRAMUSCULAR | Status: DC | PRN
Start: 2019-07-17 — End: 2019-07-17
  Administered 2019-07-17: 4 mg via INTRAVENOUS

## 2019-07-17 MED ORDER — MECLIZINE HCL 12.5 MG OR TABS
ORAL_TABLET | ORAL | Status: AC
Start: 2019-07-17 — End: ?
  Filled 2019-07-17: qty 1

## 2019-07-17 SURGICAL SUPPLY — 21 items
ADHESIVE SKIN DERMABOND .36 ML (Other) ×2 IMPLANT
BLADE CLIPPER PIVOT 3M (Other) ×1 IMPLANT
BLADE STRAIGHT 35MM (Blade) ×2 IMPLANT
CATHETER IV BD INSYTE-N 24GA .56IN (Catheter) ×4 IMPLANT
DRAPE UTILITY 26 X 15IN PK/2 (Drape) ×2 IMPLANT
GLOVE SURG 7 BIOGEL PI MICRO PF (Glove) ×10 IMPLANT
GLOVE SURG POLYISOPRENE 7.5 BIOGEL PF (Glove) ×4 IMPLANT
GOWN AURORA AAMI LEVEL 3 XL BLUE (Gown) ×2 IMPLANT
MATERIAL BACKGRND BLUE 50X25MM MERCIAN 1MM SIL (Other) ×2 IMPLANT
PACK MICRO UROLOGY (Pack) ×2 IMPLANT
PROTECT MED 6.75X6IN COTTON POSITION KERLIX (Sponge) ×2 IMPLANT
RAZOR PREP BLUE FACIAL STEEL 2 BLADE ANGLE HEAD (Other) ×2 IMPLANT
SLIDE GL 1X3 PLN ST (Other) ×4 IMPLANT
SUPP SCROTAL 2.75IN 38-44IN LG REG POLYESTER (Other) ×2 IMPLANT
SUTURE CHROMIC 5-0 CV-23 30IN (Suture) IMPLANT
SUTURE MONOCRYL 4-0 PC-3 18IN UNDYED (Suture) ×2 IMPLANT
SUTURE MONOSOFT 9-0 DOUBLE ARM SE-140-8 12IN LENGTH (Suture) ×2 IMPLANT
SUTURE POLYSORB 4-0 V-20 30IN UNDYED (Suture) ×2 IMPLANT
SUTURE VICRYL 9-0 TG140-8 12IN VIOLET (Suture) ×2 IMPLANT
SYRINGE TUBERCULIN 1ML (Syringe) ×4 IMPLANT
TOWEL OR DISPOSABLE STERILE BLUE 6/PK (Towel) ×2 IMPLANT

## 2019-07-17 NOTE — Anesthesia Procedure Notes (Signed)
Airway Placement    Staff:  Performing Provider: Johnella Moloney, MD  Authorizing Provider: Shana Chute, MD    Airway management:   Patient location: OR/Procedural area  Final airway type: supraglottic airway  Intubation reason: general anesthesia    Induction:  Positioning: supine  Patient was pre-oxygenated: yes  Apenic Oxygenation: Bag valve mask.  Mask Ventilation: Grade 0 - Ventilation by mask not attempted     Intubation:  Attempt 1  Airway Type: SGA  SGA Type: i-gel  SGA Size: 4    Attempt 2  Airway Type: SGA  LMA Type: iGel  LMA Size: LMA Size:4    Number of Attempts: 2    Assessment:  Confirmation: waveform capnography  Procedure Abandoned: no    Date / Time Airway Secured / Re-Secured:  07/17/2019 10:14 AM    Final procedure comments:  Patient reactive upon first attempt at insertion. Administered additional propofol then inserted LMA easily.

## 2019-07-17 NOTE — Discharge Instructions (Signed)
About Your Surgery:  Vasectomy Reversal        During the Procedure  You'll receive medicine to keep you pain free. You will be completely asleep for your procedure (general anesthesia). Once the medicine takes effect:  An incision is made in your scrotum.  The cut ends of each vas deferens are lifted out and examined. A section of each cut end will be removed.  The end closer to the testicles is cut until fluid flows freely. This fluid will be looked at under a microscope to see if sperm are present.  The 2 cut ends are stitched together if sperm is present. If needed, the vas may be attached directly to the epididymis (tissue behind the testicle).  When both of the vas deferens are reconnected, the incisions in the scrotum are sutured closed.    After the Procedure  When it's time to go home, have an adult family member or friend drive you. Once you're home:  Take medicine and use ice as directed below to relieve any pain.  NO SEXUAL ACTIVITY for two full weeks after your surgery.   To lessen the chance of swelling, stay off your feet as much as you can for the first day.  Place an ice pack or bag of frozen peas (wrapped in a thin towel) on your scrotum for short amounts of time. This helps reduce swelling.  Wear an athletic support or snug cotton briefs for extra support.  You may shower in 24 hours after your surgery. Allow soapy water to run over your incision, do not scrub the incision and pat dry.   No tub baths or other full submersion in water (for example, swimming, hot tubs, etc.) for 2 weeks.   Avoid heavy lifting or exercise for at least 2 weeks.     Pain Management  An ice pack often works better than pain medicine to ease pain after surgery. Put an ice pack on your penis and scrotum off and on for the first 2-3 days after surgery. DO NOT APPY DIRECTLY TO THE SKIN, but lay over the provided jock-strap or wrap in a thin towel.    You have been prescribed pain medications to help with your post operative  pain. The goal of post operative pain management is to reduce your pain level to a point where you are able to move well enough to do your basic daily activities such as walk, eat, use the bathroom and take deep breaths. You may not be completely pain free. We expect the immediate post operative pain to last approximately 3 - 7 days although this timeframe varies by individual. Within this timeframe, you should begin to decrease the dose and frequency of your medication, with the goal of being off prescription pain medicine completely by 7 - 10 days after injury or surgery.    We recommend the following goals for treatment of acute post operative/ post injury pain:   Mild and moderate pain: Take Tylenol 650 mg every 6 hours and ibuprofen 600 mg every 6 hours, alternating every three hours (do not take both together - for example, Tylenol at noon then ibuprofen at 3pm, Tylenol at 6pm then ibuprofen at 9pm, etc.) as needed unless otherwise instructed by your physician. Take ibuprofen with food and do not take for longer than 5 days.  Severe pain: Take your narctic pain medication (oxycodone), 1 tablet every 4 hours as needed for up to 5 days.  If severe, uncontrollable pain occurs, please   seek medical attention or go to the nearest Emergency Department    SAFE PAIN MEDICATION PRESCRIBING:   For your SAFETY, we follow these rules when helping you with your pain:  1.) We look for and treat pain that is due that is a result of your SURGERY or ACUTE INJURY. We use our best clinical judgement when treating pain. These recommendations follow legal and ethical standards of care.  2.) We do not treat CHRONIC or preexisting pain. You should have only one provider and one pharmacy helping you with pain. We usually do not prescribe pain medication if you already receive pain medication from another health care provider.  3.) If pain prescriptions are needed for pain, we will give an amount consistent with the Pain Management  Guidelines detailed above. We do not prescribe refills.    Activity  OK to shower in 24 hours. Wear the provided jock strap or snug fitting underwear for the first week or as long as the support is helpful.  Avoid sexual activity, including masturbation, 2 weeks; OK to resume sexual activity at that time.  No strenuous activity for 1 week. You may increase your activity as tolerated after this time.  The first 3 or 4 days after your surgery, do as little as possible. Walking is fine. By 2 weeks, most men are able to resume light exercise.  Avoid any activity where the scrotum is soaked in water such as tub baths or swimming for 2 weeks. It is OK to shower. Do not scrub your incision, allow soapy water to run over it and pat dry with a towel.    Follow-up Visit  You will have a follow-up appointment at 6 weeks for a semen analysis. If you do not have one scheduled, please call the [facility] at [phone] to schedule your semen analysis.        When to call your healthcare provider  Call the healthcare provider right away if:  You have a fever of 100.4F (38C) or higher, or as directed by your healthcare provider.  Difficulty breathing  Headache or visual disturbances  Extreme fatigue  Persistent dizziness or light headedness  Persistent nausea and vomiting  Problems obtaining medications  Signs of infection (pain, swelling, redness, odor or green/yellow discharge around incision site)  Severe uncontrolled pain      Discharge Instructions: After Your Surgery  You've just had surgery. During surgery, you were given medicine called anesthesia to keep you relaxed and free of pain. After surgery, you may have some pain or nausea. This is common. Here are some tips for feeling better and getting well after surgery.     Stay on schedule with your medicine.   Going home  Your healthcare provider will show you how to take care of yourself when you go home. He or she will also answer your questions. Have an adult  family member or friend drive you home. For the first 24 hours after your surgery:  Don't drive or use heavy equipment.  Don't make important decisions or sign legal papers.  Don't drink alcohol.  Have someone stay with you, if needed. He or she can watch for problems and help keep you safe.  Be sure to go to all follow-up visits with your healthcare provider. And rest after your surgery for as long as your healthcare provider tells you to.  Coping with pain  If you have pain after surgery, pain medicine will help you feel better. Take it   as told, before pain becomes severe. Also, ask your healthcare provider or pharmacist about other ways to control pain. This might be with heat, ice, or relaxation. And follow any other instructions your surgeon or nurse gives you.  Tips for taking pain medicine  To get the best relief possible, remember these points:  Pain medicines can upset your stomach. Taking them with a little food may help.  Most pain relievers taken by mouth need at least 20 to 30 minutes to start to work.  Don't wait till your pain becomes severe before you take your medicine. Try to time your medicine so that you can take it before starting an activity. This might be before you get dressed, go for a walk, or sit down for dinner.  Constipation is a common side effect of pain medicines. Call your healthcare provider before taking any medicines such as laxatives or stool softeners to help ease constipation. Also ask if you should skip any foods. Drinking lots of fluids and eating foods such as fruits and vegetables that are high in fiber can also help. Remember, don't take laxatives unless your surgeon has prescribed them.  Drinking alcohol and taking pain medicine can cause dizziness and slow your breathing. It can even be deadly. Don't drink alcohol while taking pain medicine.  Pain medicine can make you react more slowly to things. Don't drive or run machinery while taking pain medicine.  Your healthcare  provider may tell you to take acetaminophen to help ease your pain. Ask him or her how much you are supposed to take each day. Acetaminophen or other pain relievers may interact with your prescription medicines or other over-the-counter (OTC) medicines. Some prescription medicines have acetaminophen and other ingredients. Using both prescription and OTC acetaminophen for pain can cause you to overdose. Read the labels on your OTC medicines with care. This will help you to clearly know the list of ingredients, how much to take, and any warnings. It may also help you not take too much acetaminophen. If you have questions or don't understand the information, ask your pharmacist or healthcare provider to explain it to you before you take the OTC medicine.  Managing nausea  Some people have an upset stomach after surgery. This is often because of anesthesia, pain, or pain medicine, or the stress of surgery. These tips will help you handle nausea and eat healthy foods as you get better. If you were on a special food plan before surgery, ask your healthcare provider if you should follow it while you get better. These tips may help:  Don't push yourself to eat. Your body will tell you when to eat and how much.  Start off with clear liquids and soup. They are easier to digest.  Next try semi-solid foods, such as mashed potatoes, applesauce, and gelatin, as you feel ready.  Slowly move to solid foods. Don't eat fatty, rich, or spicy foods at first.  Don't force yourself to have 3 large meals a day. Instead eat smaller amounts more often.  Take pain medicines with a small amount of solid food, such as crackers or toast, to prevent nausea.  When to call your healthcare provider  Call your healthcare provider if:  You still have intolerable pain an hour after taking medicine. The medicine may not be strong enough.  You feel too sleepy, dizzy, or groggy. The medicine may be too strong.  You have side effects such as nausea or  vomiting, or skin changes such as rash,   itching, or hives. Your healthcare provider may suggest other medicines to control side effects.  Rash, itching, or hives may mean you have an allergic reaction. Report this right away. If you have trouble breathing or facial swelling, call 911 right away.  If you have obstructive sleep apnea  You were given anesthesia medicine during surgery to keep you comfortable and free of pain. After surgery, you may have more apnea spells because of this medicine and other medicines you were given. The spells may last longer than usual.   At home:  Keep using the continuous positive airway pressure (CPAP) device when you sleep. Unless your healthcare provider tells you not to, use it when you sleep, day or night. CPAP is a common device used to treat obstructive sleep apnea.  Talk with your provider before taking any pain medicine, muscle relaxants, or sedatives. Your provider will tell you about the possible dangers of taking these medicines.  [employer] last reviewed this educational content on 05/17/2017   2000-2020 [employer] [address], [place] [address]. All rights reserved. This information is not intended as a substitute for professional medical care. Always follow your healthcare professional's instructions.          When You Leave the Hospital  Pain control and safety for patients   Pain Control  We want to help you control your pain so that you can do the activities that will help you recover after being in the hospital. But, you will have some pain and discomfort while you heal. Realistic goals for pain control are to prevent severe pain and keep pain at a lower level. Most people have mild to moderate pain with activity while they are recovering.   Your pain control plan includes taking medicines and using non-drug methods. Some of the stronger medicines we prescribe may be opioids. Oxycodone and hydromorphone (Dilaudid) are 2 types of opioids.    Even strong pain medicines do not remove all pain. Please also use non-drug methods such as heat, cold, or relaxation to help control your pain.   We will give you a limited amount of pain medicine to help manage your pain before your next clinic visit. If you are taking opioids, you can expect your need for them to decrease quickly.  Pain Medicines  Acetaminophen and Ibuprofen  Unless your doctor tells you otherwise, before you take any prescription pain medicine, try taking both acetaminophen (Tylenol) and ibuprofen (Advil, Motrin) to control your pain.   You do not need a prescription to buy acetaminophen and ibuprofen. You can buy them from our discharge pharmacies or your local drugstore. Follow the dose instructions on the bottles.   Opioids   Only take opioids if you have strong pain that does not lessen when you take acetaminophen and ibuprofen or use non-drug methods to control your pain.   The label on the opioid bottle gives the maximum (highest) dose you can take. Never take more than the maximum dose. Taking too much opioid can make you stop breathing or can even cause death. If your pain is under control, take less than the maximum dose.  If you do take the opioid pain medicine, try to take less and less of it each day. This is called tapering or weaning. To do this, you can:  Take a smaller dose each time; or  Increase the time between doses  Your goal is to control your pain with acetaminophen and ibuprofen and to no longer   need to take opioids. Call your prescriber's clinic if you have any questions about tapering.  Opioid Refills   To get more opioids, you must have a new prescription from the doctor who first prescribed them. If you need a refill:   Call your clinic before you run out of pills. The clinic will call your doctor to talk about your pain plan. It can take 2 to 3 business days to get your new prescription.  If your doctor says it is OK for you to get more opioids, you must come to  the clinic to pick up your new prescription.   You must then take the new prescription to your pharmacy. Opioid prescriptions cannot be called in or faxed to your pharmacy.   Opioid Safety  Do not drink alcohol while taking opioids. Using both at the same time can cause severe health problems or even death.  Never take more than your prescribed dose of opioid medicines.   Do not let anyone else use your opioid medicine.  Store your opioid medicine in a secure place, where family, visitors, children, and pets cannot reach them.   Help prevent opioid misuse and abuse. Discard any unused opioids safely (see below).  When to Call  Opioids can slow your breathing and heart rates. If they slow too much, it can cause death.   If you become too sleepy or have breathing problems:   Call 911 right away.   Do not take your next dose of opioid.   Discarding Opioids  Please discard unused or expired opioids in one of these ways:  Drop them off at one of the [facility] listed below.  Use another Medication Take-Back site. Find these online at www.takebackyourmeds.org.  If you cannot use a take-back program, flush the opioids down the toilet.  [facility]  At [facility]   [address]   Weekdays: 8:30 a.m. to 10 p.m.   Weekends and holidays: 9 a.m. to 10 p.m.  [address]   Weekdays: 8:30 a.m. to 7 p.m.   Saturdays: 9 a.m. to 4:30 p.m.  At [facility]  Ambulatory pharmacy lobby  Weekdays: 8 a.m. to 9 p.m.   Weekends: 8 a.m. to 8 p.m.   [facility] pharmacy lobby  Weekdays:  8 a.m. to 5:15 p.m.   Reviewed 10/20     Scopolamine Patch    What it is and How to Use it  What is scopolamine?  Scopolamine is a medicine that prevents nausea and vomiting. It is often used to prevent the nausea and vomiting that can occur after having general anesthesia. Scopolamine makes the nerves that cause the vomiting reflex less active.  How to Use the Scopolamine Patch    Scopolamine comes as a round patch with 1 sticky side. It is placed on the skin behind the ear. It takes a few hours for the scopolamine to work, since it has to go through the skin.   Important Precautions  After you touch or remove the patch , be sure to wash your hands thoroughly with soap and water to remove any scopolamine from them. If this drug comes into contact with your eyes, it could cause short-term blurry vision and dilation (widening) of your pupils (the dark circles in the center of your eyes). Unless you also have eye pain and the whites of your eyes get red, this is not serious. Your pupils should return to normal.   When   you throw out a used patch, it will still have active medicine in it. Follow these safety steps:  Fold the patch in half with the sticky side together.   Place it in a child-protected, covered trash can, out of the reach of children and pets.   Wearing the Patch After Surgery  Keep the patch in place for at least 24 hours after surgery. At that time, if you do not have nausea, you may remove it and throw it away. If you still have nausea, you may keep the patch on for up to 3 days.   It is OK to shower while you are wearing the patch, but it may fall off if it gets too wet. If the patch does fall off, throw it away (follow the precautions in the section above).  Side Effects  The most common side effects of the scopolamine patch are:  Dry mouth  Drowsiness  Blurry vision or enlarged pupils (this usually occurs if you have scopolamine on your fingers and then touch your eye)  Very rarely, the patch may cause confusion, agitation, and mood or behavior changes. This is more common in the elderly.  Call the number below if you have:  Difficulty urinating (remove the patch)  Pain and reddening of your eyes, with enlarged pupils (remove the patch)  Skin rash  Change in your heart rate or rhythm  Severe constipation    Reviewed 12/2016

## 2019-07-17 NOTE — Anesthesia Postprocedure Evaluation (Signed)
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    Place of evaluation: PACU    Patient participation: patient participated    Level of consciousness: fully conscious    Patient pain control satisfaction: patient is satisfied with level of pain control    Airway patency: patent    Cardiovascular status during assessment: stable    Respiratory status during assessment: breathing comfortably    Anesthetic complications: no    Intravascular volume status assessment: euvolemic    Nausea / vomiting: patient is not experiencing nausea      Planned post-operative disposition at time of assessment: hospital discharge

## 2019-07-17 NOTE — Op Note (Signed)
OPERATIVE REPORT    Procedure Date: 07/17/19    Attending Surgeon: Oswaldo Milian, MD    Assistant Surgeon: Denver Faster, MD    Preoperative Diagnosis(es): Obstructive azoospermia    Postoperative Diagnosis(es): Obstructive azoospermia    Procedures Performed:        1. RIGHT vasovasostomy (modified one layer) WITHOUT testicular sperm extraction       2. LEFT vasovasostomy (modified one layer)       3. Microscopic examination of bilateral vasal fluid    Anesthesia: General    Estimated Blood Loss: 10ml    Specimens: None    Drains: None    Complications: None    Disposition: Home following recovery in PACU    Indication: Mr. Azavier Creson is a 34 year old year old man with history of vasectomy in 2018 who presents for vasectomy reversal. ?Risks, benefits and alternatives were discussed with the patient prior to the procedure, all questions were answered and the consent form was signed. ?    Findings:       Right gross examination: opalescent fluid       Right microscopic examination: few non-motile sperm, many sperm heads       Left gross examination: opalescent fluid       Left microscopic examination: few non-motile sperm, many sperm heads    Description of Procedure: The patient was brought to the operating room and a briefing procedure was performed. After successful induction of general anesthesia, antibiotics were administered and the patient was prepped and draped in the usual fashion. A skin and bilateral cord block were performed with 0.5% marcaine.    A midline scrotal incision was made and the right testicle delivered. The right vas was identified and dissection was carried down to the site of his previous vasectomy. This site was excised and fluid from the testicular end of the open vas examined by microscopy with findings as above. A modified one layer closure was performed using six full thickness simple interrupted 9-0 nylon sutures with additional simple interrupted 9-0 nylon sutures to  approximate the muscular wall creating a watertight anastomosis. No testicular sperm extraction for cryopreservation was performed. The testicle was delivered back into the scrotum and we turned out attention to the other side.     The left testicle was then delivered into the field through the same incision. The left vas was identified and dissection was carried down to the site of his previous vasectomy. This site was excised and fluid from the testicular end of the open vas examined by microscopy with findings as above. A modified one layer closure was performed using six full thickness simple interrupted 9-0 nylon sutures with additional simple interrupted 9-0 nylon sutures to approximate the muscular wall creating a watertight anastomosis. Hemostasis was achieved with electrocautery prior to closure of the wound. The testis was delivered back into the scrotum and the scrotum was closed in two layers using a running locking suture of 4-0 Vicryl for the inner layer and a running horizontal mattress 4-0 Biosyn on the skin.    The patient tolerated the procedure well. He was awoken from anesthesia and returned to the recovery unit in stable condition.

## 2019-07-17 NOTE — H&P (Signed)
Pre-Operative History and Physical     SERVICE: Urology     SUBJECTIVE:   Philip Schmidt is a 34 year old male with a history of vasectomy who presents for vasectomy reversal. They report no change in their health since last seen in clinic. No fever, chills, cough, nausea, vomiting or any other signs or symptoms of active infection.      ALLERGIES:  Allergies as of 06/05/2019 - Reviewed 05/27/2019   Allergen Reaction Noted    Bee pollen  05/27/2019        PAST MEDICAL HISTORY:  Past Medical History:   Diagnosis Date    Allergic rhinitis due to allergen 2015        PAST SURGICAL HISTORY:  Past Surgical History:   Procedure Laterality Date    HERNIA REPAIR  1992    LIGATION PRQ VAS DEFERENS UNI/BI SPX  2018        FAMILY HISTORY: No change from last visit.   SOCIAL HISTORY:  No change from last visit.   REVIEW OF SYSTEMS:  Negative except as stated in HPI     OBJECTIVE:   Gen: NAD, A&Ox3  HEENT: NC/AT, EOMI  Resp: Breathing comfortably on RA, no accessory muscle use, clear to ascultation bilaterally  Card: Regular heart rate and rhythm   GU: No evidence of active infection      ASSESSMENT/PLAN:   To OR for vasectomy reversal. Risks, benefits and alternatives were discussed with the patient in detail and the consent for was signed.       Author: Erby Pian, MD - Senior Fellow/Acting Instructor, Andrology/Men's Health

## 2019-07-23 NOTE — Addendum Note (Signed)
Addendum  created 07/23/19 1836 by Shana Chute, MD    Clinical Note Signed

## 2019-07-25 ENCOUNTER — Telehealth (HOSPITAL_BASED_OUTPATIENT_CLINIC_OR_DEPARTMENT_OTHER): Payer: Self-pay | Admitting: Student in an Organized Health Care Education/Training Program

## 2019-07-25 ENCOUNTER — Ambulatory Visit: Payer: Self-pay

## 2019-07-25 NOTE — Telephone Encounter (Signed)
Pt had vasectomy reversal on 4/30, pt c/o continued swelling to scrotum, 1.5 x 1.5" area of "hard tissue" to left side of scrotum near incision site, states it is more swollen than right side, worried about going back to work, endorses large increase in pain while on his feet for extended periods of time. Pt denies fever, redness to site.    Paged MD Loftus @ [phone], [phone]    Spoke with MD Loftus, warm transfer to pt for further.    Reason for Disposition   [1] Caller has URGENT question AND [2] triager unable to answer question    Protocols used: POST-OP SYMPTOMS AND QUESTIONS-ADULT - AH

## 2019-07-25 NOTE — Telephone Encounter (Signed)
S: patient calls with postop complaint    B: s/p vas reversal on 4/30    on left side, there is a 1.5x1.5in firm-feeling area. This area is not painful. Right sight of scrotum is swollen but soft.  pain is better with taking medications and is icing the scrotum. He has been doing scrotal support. No erythema or fevers. Scrotum does not feel not, incision is healing well.     A: normal swelling after surgery  R: continue scrotal support and pain meds as needed. Ice 3-4x per day. Call back if the firm area does not improve over the next week or so

## 2019-08-28 ENCOUNTER — Other Ambulatory Visit (HOSPITAL_BASED_OUTPATIENT_CLINIC_OR_DEPARTMENT_OTHER): Payer: No Typology Code available for payment source

## 2019-12-08 ENCOUNTER — Emergency Department: Payer: Self-pay

## 2020-09-07 ENCOUNTER — Emergency Department: Payer: Self-pay

## 2020-09-12 ENCOUNTER — Emergency Department: Payer: Self-pay
